# Patient Record
Sex: Female | Born: 1950 | Race: White | Hispanic: No | Marital: Married | Smoking: Former smoker
Health system: Southern US, Academic
[De-identification: ages and names within clinical notes are randomized; demographics above are authoritative.]

## PROBLEM LIST (undated history)

## (undated) DIAGNOSIS — Z9289 Personal history of other medical treatment: Secondary | ICD-10-CM

## (undated) DIAGNOSIS — I10 Essential (primary) hypertension: Secondary | ICD-10-CM

## (undated) DIAGNOSIS — E559 Vitamin D deficiency, unspecified: Secondary | ICD-10-CM

## (undated) DIAGNOSIS — M810 Age-related osteoporosis without current pathological fracture: Secondary | ICD-10-CM

## (undated) DIAGNOSIS — E538 Deficiency of other specified B group vitamins: Secondary | ICD-10-CM

## (undated) DIAGNOSIS — K219 Gastro-esophageal reflux disease without esophagitis: Secondary | ICD-10-CM

## (undated) DIAGNOSIS — Z853 Personal history of malignant neoplasm of breast: Secondary | ICD-10-CM

## (undated) DIAGNOSIS — D649 Anemia, unspecified: Secondary | ICD-10-CM

## (undated) DIAGNOSIS — E7800 Pure hypercholesterolemia, unspecified: Secondary | ICD-10-CM

## (undated) DIAGNOSIS — E079 Disorder of thyroid, unspecified: Secondary | ICD-10-CM

## (undated) HISTORY — DX: Personal history of other medical treatment: Z92.89

## (undated) HISTORY — DX: Gastro-esophageal reflux disease without esophagitis: K21.9

## (undated) HISTORY — DX: Essential (primary) hypertension: I10

## (undated) HISTORY — PX: HX BREAST LUMPECTOMY: SHX2

## (undated) HISTORY — DX: Personal history of malignant neoplasm of breast: Z85.3

## (undated) HISTORY — PX: HX COLONOSCOPY: 2100001147

## (undated) HISTORY — DX: Vitamin D deficiency, unspecified: E55.9

## (undated) HISTORY — DX: Anemia, unspecified: D64.9

## (undated) HISTORY — DX: Disorder of thyroid, unspecified: E07.9

## (undated) HISTORY — DX: Age-related osteoporosis without current pathological fracture: M81.0

## (undated) HISTORY — PX: HX RADICAL MASTECTOMY: SHX4

## (undated) HISTORY — DX: Pure hypercholesterolemia, unspecified: E78.00

## (undated) HISTORY — DX: Deficiency of other specified B group vitamins: E53.8

## (undated) HISTORY — PX: HX TUBAL LIGATION: SHX77

---

## 1995-09-15 ENCOUNTER — Other Ambulatory Visit (HOSPITAL_COMMUNITY): Payer: Self-pay | Admitting: INTERNAL MEDICINE

## 2019-08-29 IMAGING — MG 3D DX MAMMO UNI W/CAD
3 series · 3 of 12 positions shown · non-contrast
Comparison: 03/25/2019 and 03/22/2018.

------------- REPORT [id number] -------------
EXAM:  3D LEFT DIAGNOSTIC DIGITAL MAMMOGRAM WITH TOMOSYNTHESIS AND CAD
INDICATION: History of right breast cancer, status post mastectomy.

[L CC]
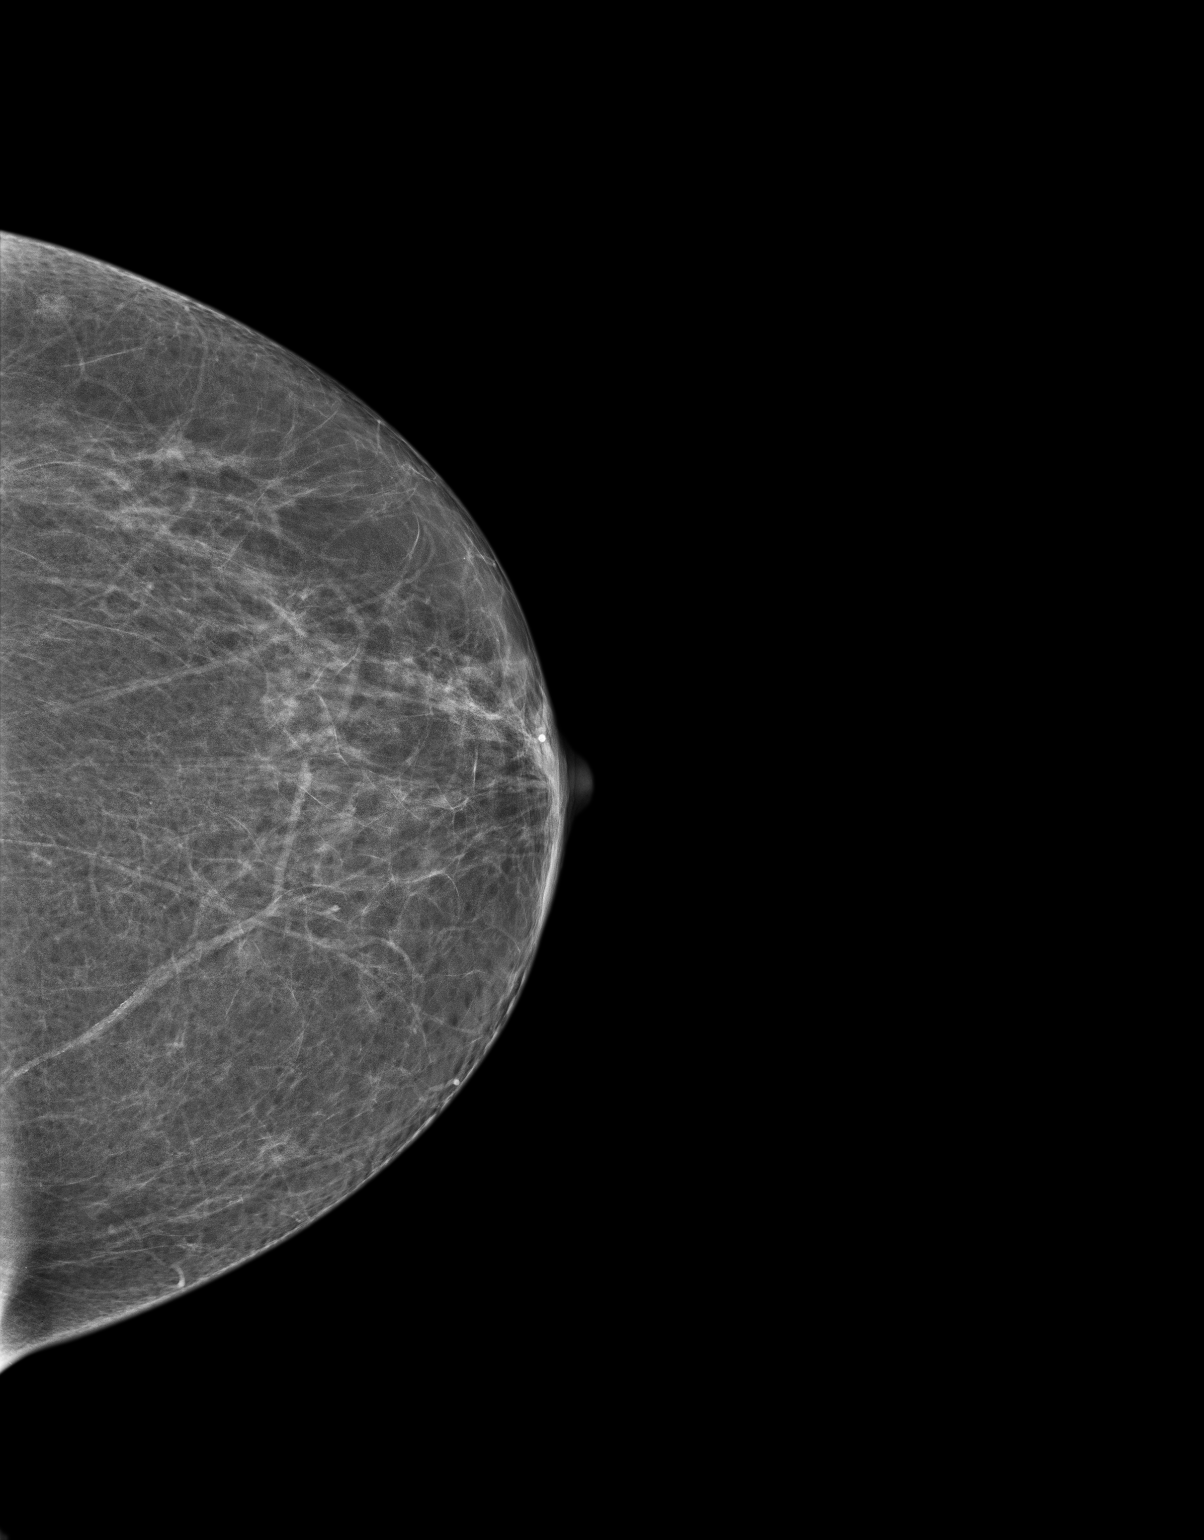

[3D DX MAMMO UNI W/CAD · tomo slice 11/69.0]
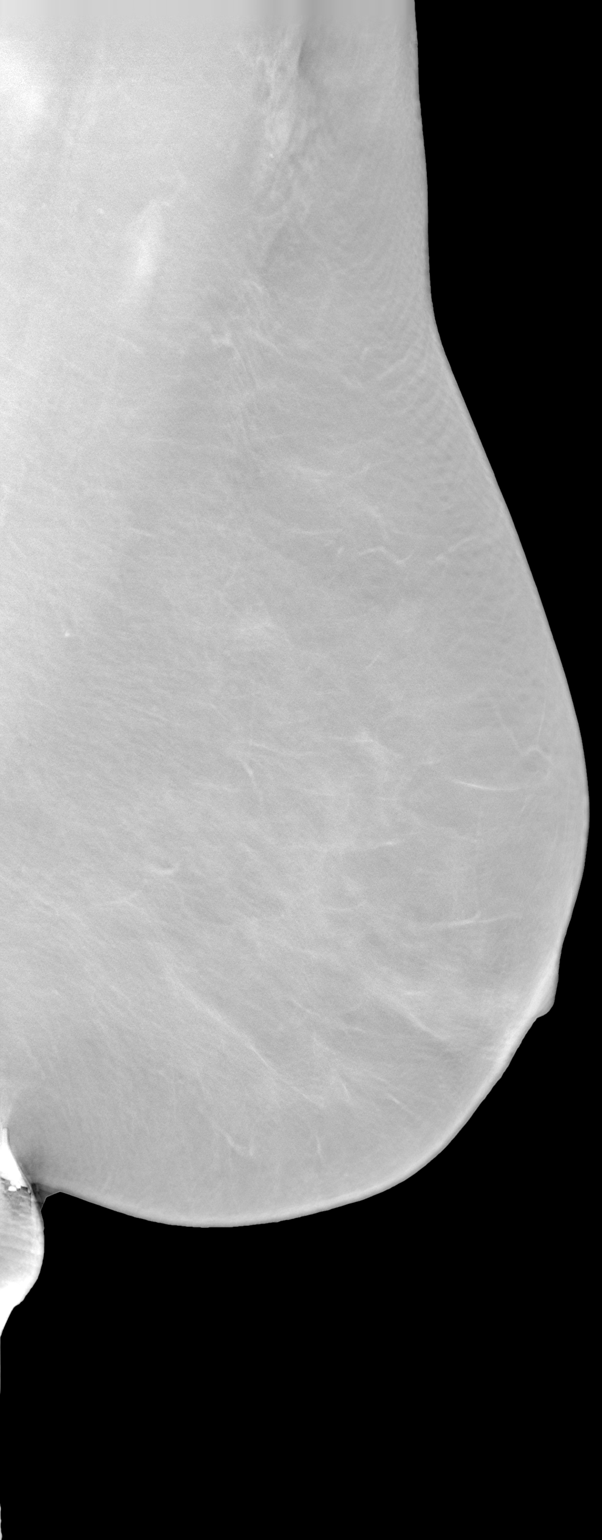

[L]
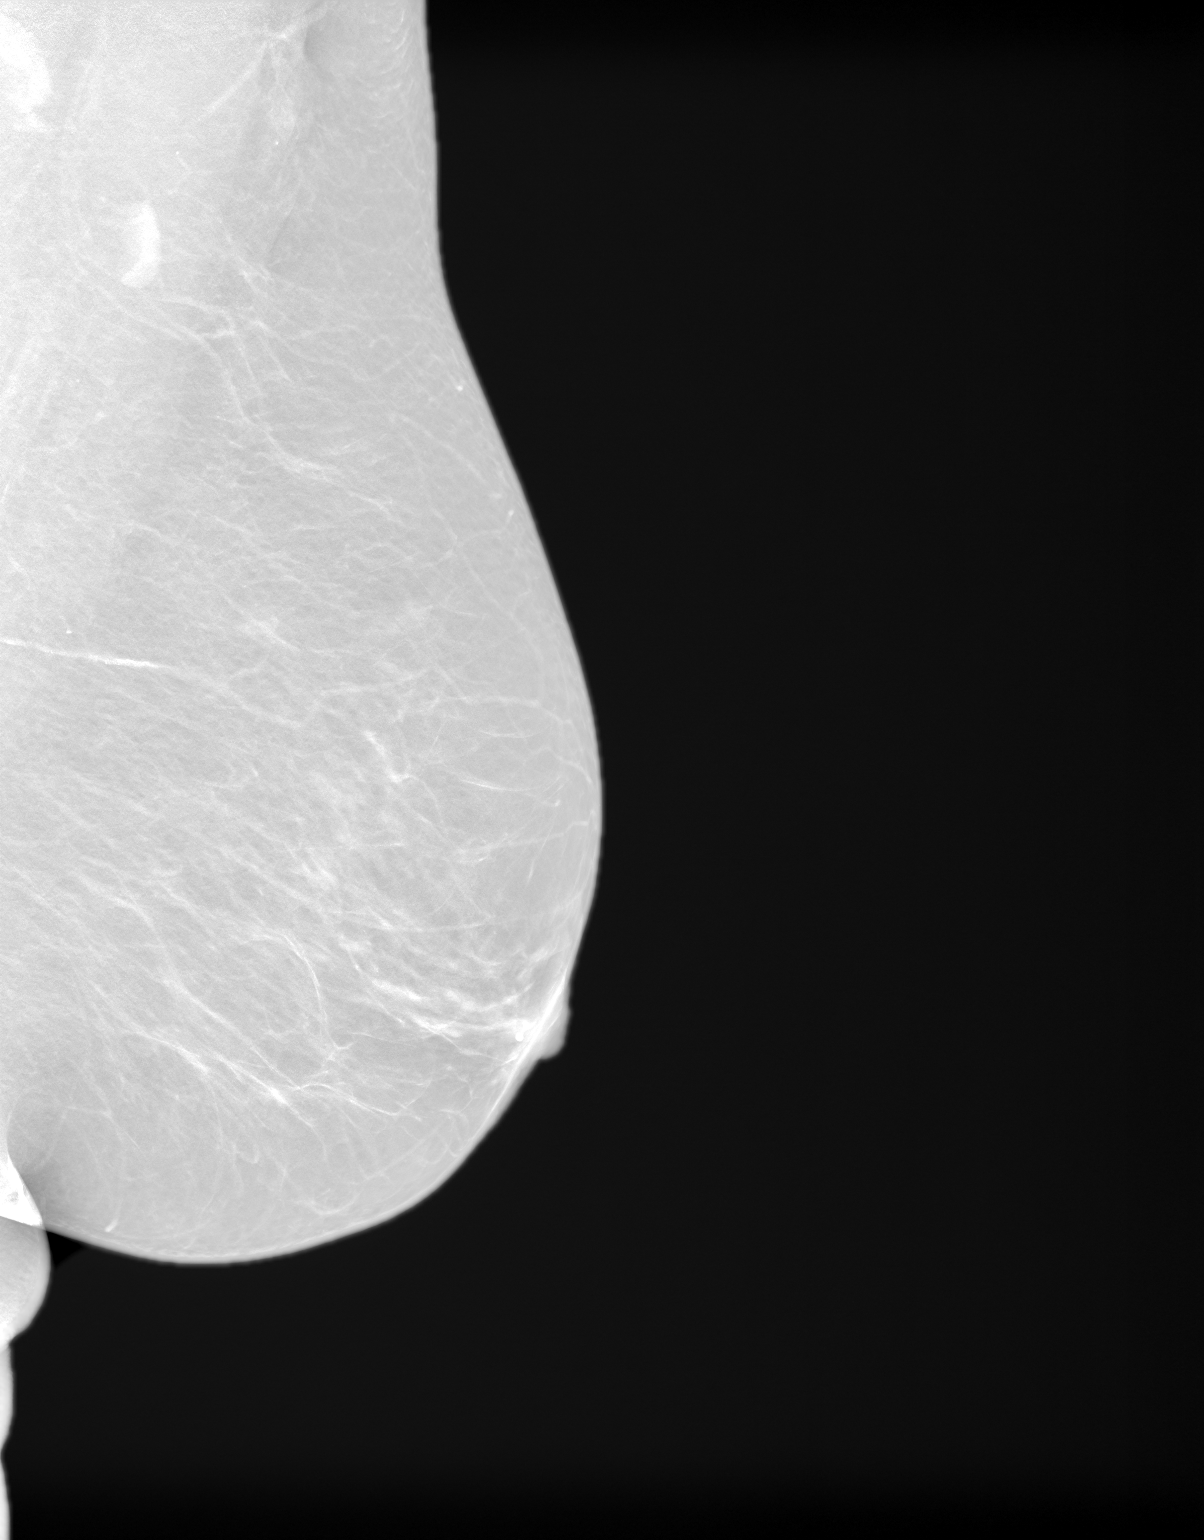

[3 of 12 positions shown; findings below may reference images not displayed]

FINDINGS: There are scattered fibroglandular elements.  There is no mass or suspicious cluster of microcalcifications.   There is no architectural distortion, skin thickening or nipple retraction.
IMPRESSION: 1.  BIRADS 2-Benign findings. Patient has been added in a reminder system with a target date for the next screening mammography.

2.  DENSITY CODE –  B (Scattered areas of fibroglandular density).

Final Assessment Code:

Bi-Rads 2 

BI-RADS 0
Need additional imaging evaluation

BI-RADS 1
Negative mammogram

BI-RADS 2
Benign finding

BI-RADS 3
Probably benign finding; short-interval follow-up suggested

BI-RADS 4
Suspicious abnormality; biopsy should be considered

BI-RADS 5
Highly suggestive of malignancy; appropriate action should be taken

BI-RADS 6
Known biopsy-proven malignancy; appropriate action should be taken

NOTE:
In compliance with Federal regulations, the results of this mammogram are being sent to the patient.

------------- REPORT [id number] -------------
[facility]
[address]
[place] Ms.[place], [place]:
We wish to report the following on your recent mammography examination. We are sending a report to your referring physician or other health care provider. 
(       Normal/Negative:
No evidence of cancer.
This statement is mandated by the [place], Department of Health.
Your examination was performed by one of our technologists, who are registered radiological technologists and also specially certified in mammography:
___
Parlak, Edaly (M)
___
Nepomuceno, Martinez (M)

Your mammogram was interpreted by our radiologist.

( 
Sofeine Made, M.D.

(Annual Breast Examination by a physician or other health care provider
(Annual Mammography Screening beginning at age 40
(Monthly Breast Self Examination

## 2020-09-14 IMAGING — MG 3D DX MAMMO UNI W/CAD & TOMO
3 series · 3 of 12 positions shown · non-contrast
Comparison: 10/06/2020 and 08/11/2019.

------------- REPORT [id number] -------------
[facility]
[address]
[place] Ms.[place], [place] ARVY[place]AS:
We wish to report the following on your recent mammography examination. We are sending a report to your referring physician or other health care provider. 
(       Normal/Negative:
No evidence of cancer.
This statement is mandated by the [place], Department of Health.
Your examination was performed by one of our technologists, who are registered radiological technologists and also specially certified in mammography:
___
Parlak, Edaly (M)
Nepomuceno, Martinez (M)

Your mammogram was interpreted by our radiologist.
( 
Sofeine Made, M.D.
(Annual Breast Examination by a physician or other health care provider
(Annual Mammography Screening beginning at age 40
(Monthly Breast Self Examination
------------- REPORT [id number] -------------
﻿
                        :           &
                         #:
EXAM:  3D LEFT DIAGNOSTIC DIGITAL MAMMOGRAM WITH CAD AND TOMOSYNTHESIS
INDICATION: History of right breast cancer, status post mastectomy.

[L]
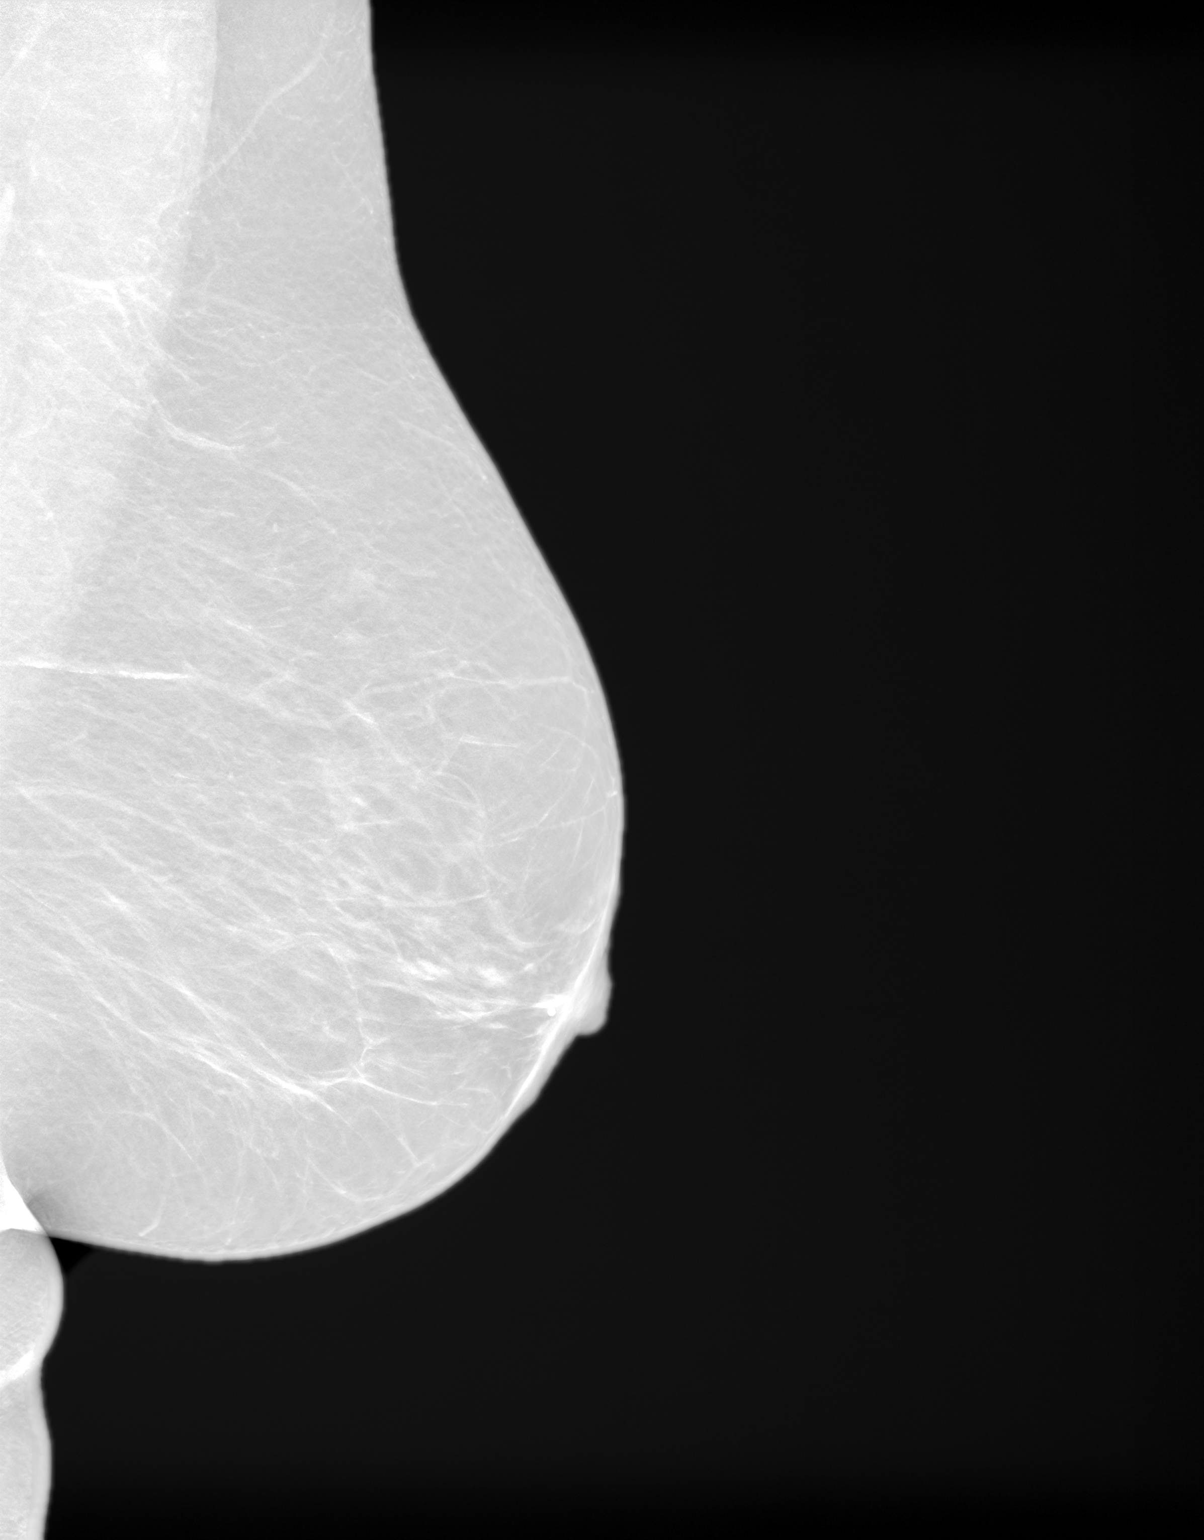

[L CC tomo]
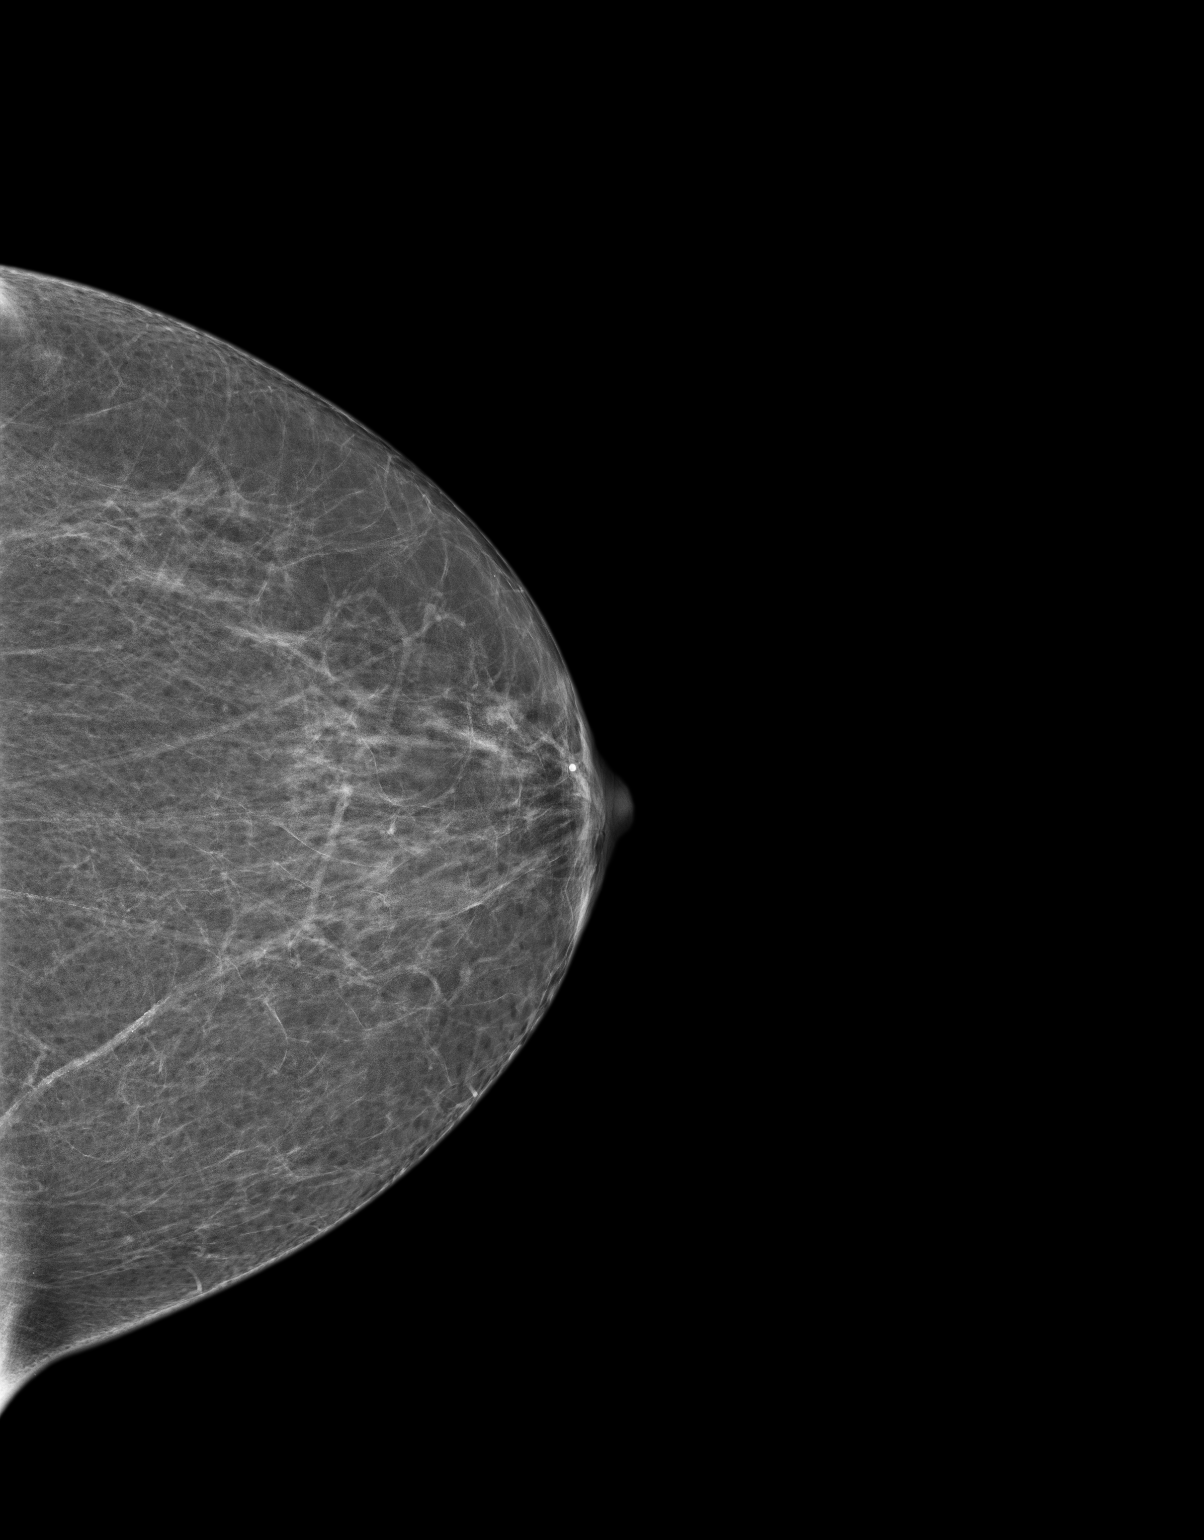

[3D DX MAMMO UNI W/CAD & TOMO tomo · tomo slice 11/68.0]
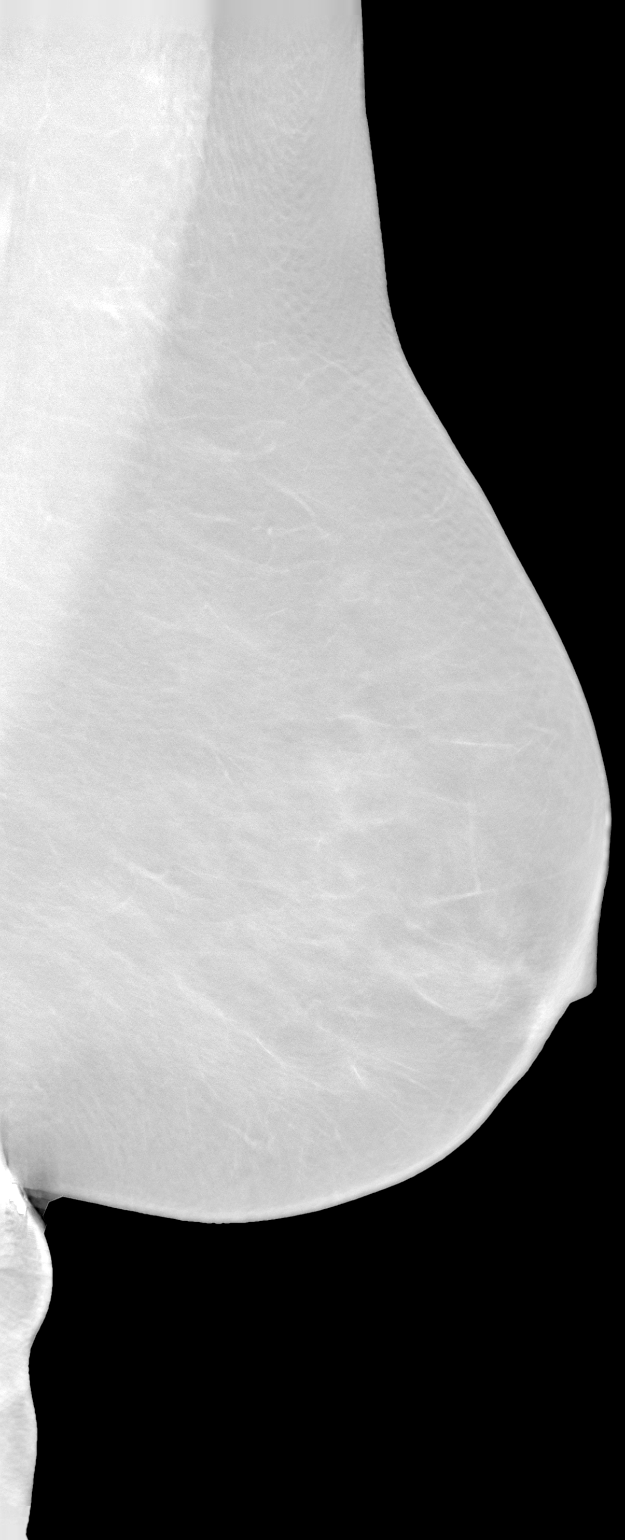

[3 of 12 positions shown; findings below may reference images not displayed]

FINDINGS: There are scattered fibroglandular elements.  There is no mass or suspicious cluster of microcalcifications.   There is no architectural distortion, skin thickening or nipple retraction.
IMPRESSION: 1.  BIRADS 2-Benign findings. Patient has been added in a reminder system with a target date for the next screening mammography.

2.  DENSITY CODE – B (Scattered areas of fibroglandular density). 

Final Assessment Code:

Bi-Rads 2 

BI-RADS 0
Need additional imaging evaluation.

BI-RADS 1
Negative mammogram.

BI-RADS 2
Benign finding.

BI-RADS 3
Probably benign finding; short-interval follow-up suggested.

BI-RADS 4
Suspicious abnormality; biopsy should be considered.

BI-RADS 5
Highly suggestive of malignancy; appropriate action should be taken.

BI-RADS 6
Known biopsy-proven malignancy; appropriate action should be taken.

NOTE:
In compliance with Federal regulations, the results of this mammogram are being sent to the patient.

## 2021-03-13 LAB — ENTER/EDIT EXTERNAL COMMON LAB RESULTS
CHOLESTEROL: 209
HDL-CHOLESTEROL: 53
LDL (CALCULATED): 125
LDL CHOLESTEROL,DIRECT: 31
TRIGLYCERIDES: 174

## 2021-09-13 LAB — ENTER/EDIT EXTERNAL COMMON LAB RESULTS
CHOLESTEROL: 216
HDL-CHOLESTEROL: 50
HEMOGLOBIN A1C: 5.6
LDL (CALCULATED): 128
LDL CHOLESTEROL,DIRECT: 38
TRIGLYCERIDES: 188

## 2021-09-27 ENCOUNTER — Encounter (INDEPENDENT_AMBULATORY_CARE_PROVIDER_SITE_OTHER): Payer: Self-pay | Admitting: NURSE PRACTITIONER

## 2021-09-27 ENCOUNTER — Ambulatory Visit (INDEPENDENT_AMBULATORY_CARE_PROVIDER_SITE_OTHER): Payer: Medicare Other | Admitting: NURSE PRACTITIONER

## 2021-09-27 ENCOUNTER — Other Ambulatory Visit: Payer: Self-pay

## 2021-09-27 VITALS — BP 135/80 | HR 80 | Temp 98.3°F | Ht 63.0 in | Wt 149.0 lb

## 2021-09-27 DIAGNOSIS — D0511 Intraductal carcinoma in situ of right breast: Secondary | ICD-10-CM | POA: Insufficient documentation

## 2021-09-27 DIAGNOSIS — D508 Other iron deficiency anemias: Secondary | ICD-10-CM | POA: Insufficient documentation

## 2021-09-27 DIAGNOSIS — D0510 Intraductal carcinoma in situ of unspecified breast: Secondary | ICD-10-CM

## 2021-09-27 NOTE — Patient Instructions (Signed)
Please take the labs orders given today and go to the first floor registration.  Once registered you will be directed to the lab for completion.  If you choose you may take your labs to [facility] as well.  Please be aware that it does take longer for me to receive results from [facility].  We will call you if needed once your labs are received. I encourage you to keep all your scheduled appointment with physicians.  Mammograms are recommended yearly.  Colonoscopy is recommended starting at age 50 unless symptoms for family history warrant sooner.  Patient with colon cancer screening is based off of when initial diagnosis was and most recent colonoscopy findings.  Immunization recommendations are based on age.   I look forward to seeing you for your next follow up in 4 months.  Please call if you have any questions or concerns.      If you have not already signed up for  [facility], I strongly encourage you to do so.   It will allow to you message us with questions, schedule and reschedule appointments. Once the hospital goes to [place] in March of 2023 it will allow you to see your labs and imaging as well.  If you have not given us your e-mail address and you want to sign up for this service, please give check in or check out your e-mail.  You will receive an e-mail and a link to sign up and then will need to download the app on your phone.    Our office number is [phone].  You will be directed to the call center and then a message will be sent to our office.  If you are awaiting a call back from us please make sure to answer in toll free call numbers, as that is currently what the phone ID shows when we call from our office.      Please be aware that when you are here in the office and we are a [facility] facility.  The hospital is not  a [facility] facility at this time and if you need a port flush or treatment you will have to be registered and scheduled with [facility].    If you are on the [facility] outpatient Oncology side for treatment or  port flush and have not seen the provider on that day, we do not know that you are here, as much as we would like to keep up with everyone we just cannot, and [facility] schedule is not our schedule.  If you have a problem while you are in the infusion center you will need to speak with the nurse who is caring for you. They will get in touch with us.  Please do not stop us in the hall, as we are trying to see patients in a timely manner.      Thank you for allowing us to be part of your healthcare team.

## 2021-09-27 NOTE — Cancer Center Note (Signed)
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      HISTORY OF PRESENT ILLNESS:  Katherine [name] is a 70 y.o. female who presents to today alone for evaluation of DCIS and Iron deficiency.  Review Dr. 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  Pap smear time she presented with a scaly lesion of the right nipple in June of 1999.  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  The nuclear grade was III/III.  She underwent a segmental resection of the lesion on May 11, 1998 and the margin was initially positive.  She underwent a wide marginal incision in August of 1999 but there remained a question of a small focus of residual DCIS noted. The patient had radiation therapy over 6 weeks time to the entire right breast.  Was placed on tamoxifen 10 mg p.o. b.i.d..  She continued to have some scaliness and discharge the right nipple.  And on 02/08/2000 another biopsy was taken of the right breast nipple and this showed the presence of Paget's disease of the nipple associated with high-grade DCIS within the underlying breast tissue.  And the additional portion of the skin from the nipple decided Paget's disease with an underlying intraductal carcinoma.  There was no evidence for any invasive disease. Because of the recurrence of disease swelling radiation therapy while on tamoxifen patient had a mastectomy completed on 02/23/2000.   In 2018 patient was found have iron deficiency anemia.  Patient receives intermittent Venofer as needed for her iron deficiency.  Her last mammogram  was 09/14/2020 was benign.     Today she reports that she is doing well.  She denies any new complaints.  She reports that she has seen Dr. Tomasita Crumble on 09/13/2021.  He is supposed to be scheduling her mammogram.  She denies any PICA, muscle weakness, and creased fatigue.  Denies fever, chills, diaphoresis, night sweats, and malaise. No infections during the interim.     Initial Diagnosis:  04/1998-  Paget's Disease with DCIS  02/08/2000 Paget's disease reoccurrence DCIS    Surgeries:  6/28/199 Right breast segmental resection with positive margins.   06/1998  Right breast wide marginal incision with questionable focus of residual DCIS  02/23/2000 right breast mastectomy    Treatment History:  Tamoxifen 10 mg PO BID for 5 years.   Radiation therapy 5040 cGy in 28 fractions over six weeks.     REVIEW OF SYSTEMS:  Review of Systems   Constitutional: Negative.    HENT:  Negative.    Eyes: Negative.    Respiratory: Negative.    Cardiovascular: Negative.    Gastrointestinal: Negative.    Endocrine: Negative.    Genitourinary: Negative.     Musculoskeletal: Negative.    Skin: Negative.    Neurological: Negative.    Hematological: Negative.    Psychiatric/Behavioral: Negative.         Past Medical History:   Diagnosis Date    Anemia  Essential hypertension     Hx of breast cancer     Hx of transfusion     Thyroid disease            Past Surgical History:   Procedure Laterality Date    HX BREAST LUMPECTOMY      HX COLONOSCOPY      HX RADICAL MASTECTOMY      HX TUBAL LIGATION             Social History     Socioeconomic History    Marital status: Married     Spouse name: Not on file    Number of children: Not on file    Years of education: Not on file    Highest education level: Not on file   Occupational History    Not on file   Tobacco Use    Smoking status: Former     Types: Cigarettes    Smokeless tobacco: Never   Vaping Use    Vaping Use: Never used   Substance and Sexual Activity    Alcohol use: Never     Drug use: Never    Sexual activity: Not on file   Other Topics Concern    Not on file   Social History Narrative    Not on file     Social Determinants of Health     Financial Resource Strain: Not on file   Food Insecurity: Not on file   Transportation Needs: Not on file   Physical Activity: Not on file   Stress: Not on file   Intimate Partner Violence: Not on file   Housing Stability: Not on file       Social History     Social History Narrative    Not on file       Social History     Substance and Sexual Activity   Drug Use Never       Family Medical History:     Problem Relation (Age of Onset)    Blood Clots Daughter    Hypertension (High Blood Pressure) Mother    Lung Cancer Father    Thyroid Disease Mother            Current Outpatient Medications   Medication Sig    ergocalciferol, vitamin D2, (DRISDOL) 1,250 mcg (50,000 unit) Oral Capsule Take 1 Capsule (50,000 Units total) by mouth Every 7 days    Ibuprofen (MOTRIN) 800 mg Oral Tablet TAKE 1 TABLET BY MOUTH ONCE DAILY AS NEEDED WITH FOOD    levothyroxine (SYNTHROID) 75 mcg Oral Tablet     lisinopriL (PRINIVIL) 10 mg Oral Tablet     montelukast (SINGULAIR) 10 mg Oral Tablet     omeprazole (PRILOSEC) 40 mg Oral Capsule, Delayed Release(E.C.)        No Known Allergies      PHYSICAL EXAM:  BP 135/80 (Site: Left, Patient Position: Sitting, Cuff Size: Adult)    Pulse 80    Temp 36.8 C (98.3 F) (Oral)    Ht 1.6 m (5\' 3" )    Wt 67.6 kg (149 lb)    BMI 26.39 kg/m        ECOG Status: (0) Fully active, able to carry on all predisease performance without restriction   Physical Exam  Vitals reviewed.   Constitutional:       Appearance: Normal appearance. She is normal weight.   HENT:      Head: Normocephalic.   Eyes:  Conjunctiva/sclera: Conjunctivae normal.      Pupils: Pupils are equal, round, and reactive to light.   Neck:      Thyroid: No thyroid mass, thyromegaly or thyroid tenderness.   Cardiovascular:      Rate and Rhythm: Normal rate and regular  rhythm.      Pulses: Normal pulses.      Heart sounds: Normal heart sounds, S1 normal and S2 normal. No murmur heard.    No S3 or S4 sounds.   Pulmonary:      Effort: Pulmonary effort is normal.      Breath sounds: Normal breath sounds.   Chest:      Comments: Patient decline breast exam today  Abdominal:      General: Bowel sounds are normal.      Palpations: Abdomen is soft.   Musculoskeletal:         General: Normal range of motion.      Cervical back: Normal range of motion and neck supple.      Right lower leg: No edema.      Left lower leg: No edema.   Lymphadenopathy:      Head:      Right side of head: No submental, submandibular, posterior auricular or occipital adenopathy.      Left side of head: No submental, submandibular, posterior auricular or occipital adenopathy.      Cervical: No cervical adenopathy.      Right cervical: No superficial, deep or posterior cervical adenopathy.     Left cervical: No superficial, deep or posterior cervical adenopathy.      Upper Body:      Right upper body: No supraclavicular or axillary adenopathy.      Left upper body: No supraclavicular or axillary adenopathy.      Lower Body: No right inguinal adenopathy. No left inguinal adenopathy.   Skin:     General: Skin is warm and dry.      Capillary Refill: Capillary refill takes less than 2 seconds.   Neurological:      General: No focal deficit present.      Mental Status: She is alert. Mental status is at baseline.      Sensory: Sensation is intact.      Motor: Motor function is intact.      Coordination: Coordination is intact.      Gait: Gait is intact.   Psychiatric:         Attention and Perception: Attention and perception normal.         Mood and Affect: Mood and affect normal.         Speech: Speech normal.         Behavior: Behavior normal. Behavior is cooperative.         Thought Content: Thought content normal.         Cognition and Memory: Cognition and memory normal.         Judgment: Judgment normal.        LABS:   05/11/21  WBC 5.7, Hgb 14.4, Hct 42.1, Plt 299,000  Iron 86, Ferritin 26, STR 16.4     ASSESSMENT:    ICD-10-CM    1. DCIS (ductal carcinoma in situ)  D05.10 CBC/DIFF     COMPREHENSIVE METABOLIC PANEL, NON-FASTING      2. Iron deficiency anemia secondary to inadequate dietary iron intake  D50.8 CBC/DIFF     COMPREHENSIVE METABOLIC PANEL, NON-FASTING     IRON     FERRITIN  IRON TRANSFERRIN AND TIBC           PLAN:   1. All relative external and internal medical records were reviewed from Dr. Pierre Bali office  including available H&Ps, progress notes, procedure notes, imaging's, laboratories, and pathology/  2.  Patient was given order for labs today to include CBC, CMP, Iron, TIBC and Ferritin.   She will let me know if her mammogram does not get scheduled.  I will see her in 4 months sooner if needed.     Return in about 4 months (around 01/25/2022).     BAILY HOVANEC was given the chance to ask questions, and these were answered to their satisfaction. The patient is welcome to call with any questions or concerns in the meantime.     On the day of the encounter, I spent a total of 29 minutes on this patient encounter including review of historical information, examination, documentation and post-visit activities.     Patrcia Dolly, APRN,FNP-BC  09/27/2021, 13:22     This note was partially generated using M[employer] Fluency Direct system, and there may be some incorrect words, spellings, and punctuation that were not noted in checking the note before saving.     CC:  Adriana Mccallum, DO  [address]  [place] [id number]

## 2021-10-27 LAB — COLOGUARD® COLON CANCER SCREEN: COLOGUARD RESULT: NEGATIVE

## 2021-10-27 LAB — LAB: COLOGUARD RESULT: NEGATIVE

## 2021-12-02 IMAGING — MG 3D DX MAMMO UNI W/CAD & TOMO
3 series · 4 of 13 positions shown · non-contrast
Comparison: 08/26/2022 and 08/09/2021.

------------- REPORT [id number] -------------
﻿

EXAM:  3D LEFT DIAGNOSTIC DIGITAL MAMMOGRAM WITH CAD AND TOMOSYNTHESIS
INDICATION: History of right breast cancer, status post mastectomy.

[L]
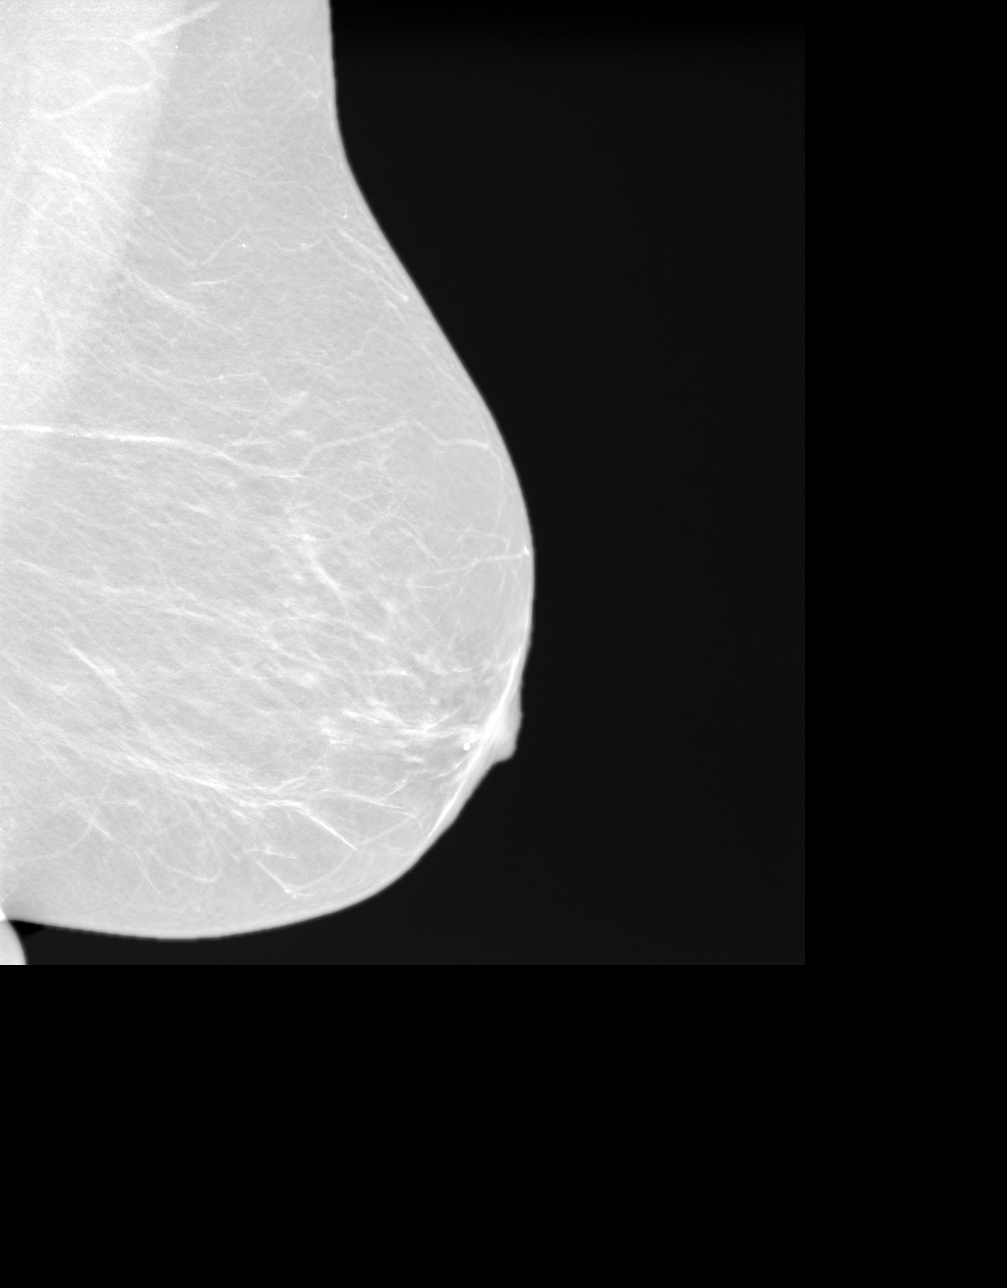

[L CC tomo · left · 0.10mm/px · 2 of 2 slices shown]
[im 1/2]
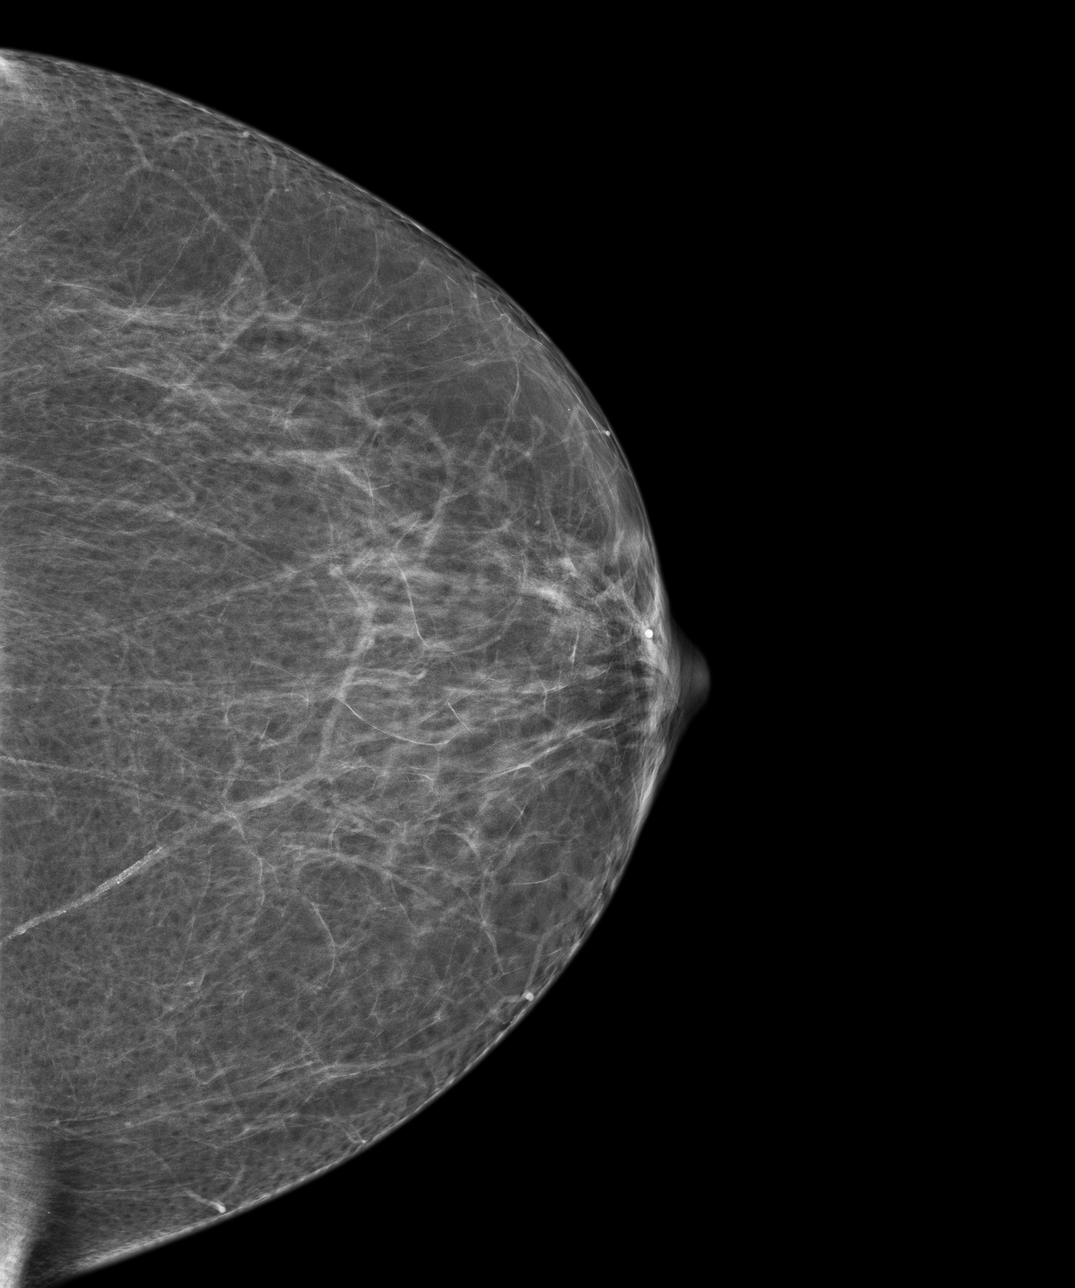
[im 2/2]
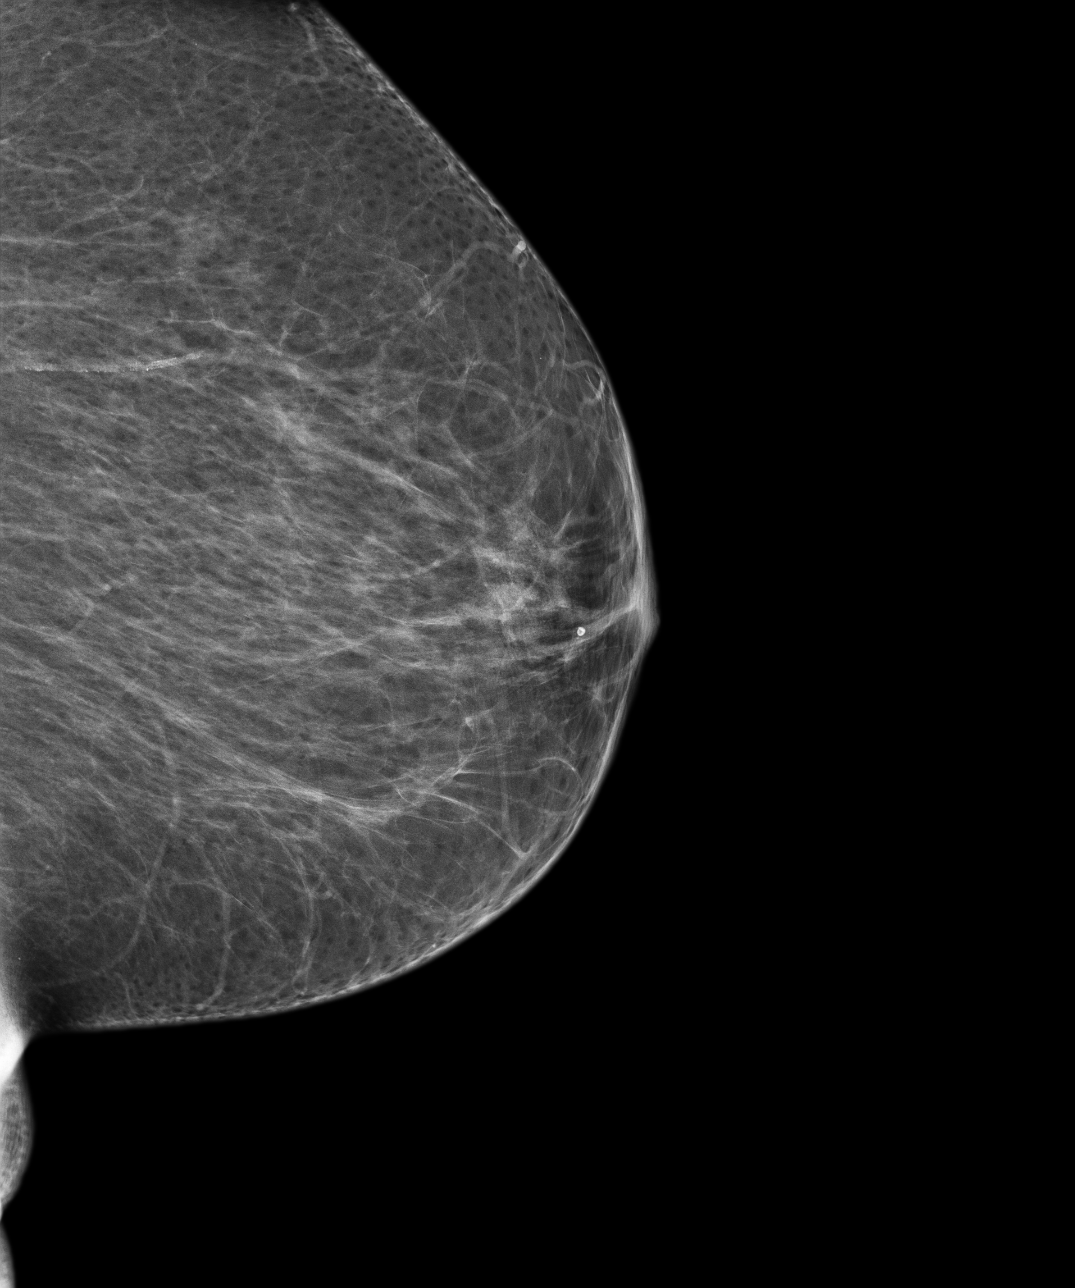

[3D DX MAMMO UNI W/CAD & TOMO tomo · tomo slice 10/64.0]
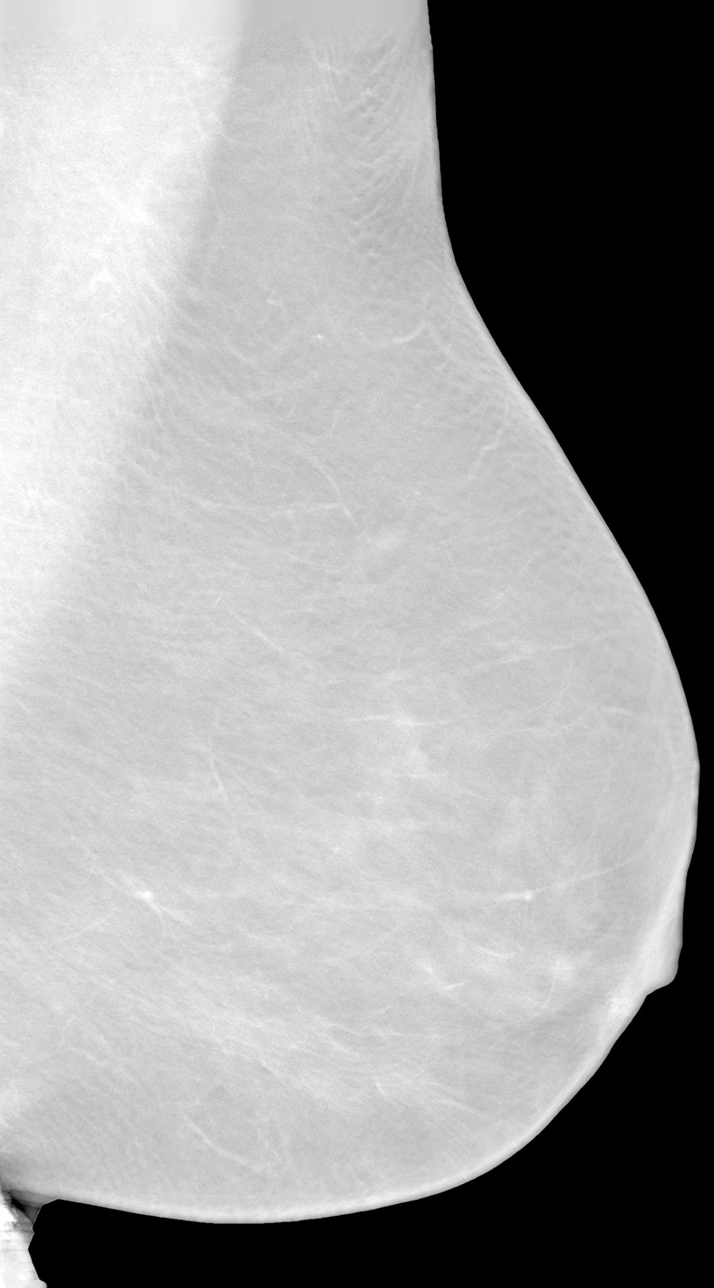

[4 of 13 positions shown; findings below may reference images not displayed]

FINDINGS: There are scattered fibroglandular elements.  There is no mass or suspicious cluster of microcalcifications.   There is no architectural distortion, skin thickening or nipple retraction.
IMPRESSION: 1.  BIRADS 2-Benign findings. Patient has been added in a reminder system with a target date for the next screening mammography.

2.  DENSITY CODE – B (Scattered areas of fibroglandular density). 

Final Assessment Code:

Bi-Rads 2 

BI-RADS 0
 Need additional imaging evaluation.

BI-RADS 1
 Negative mammogram.

BI-RADS 2
 Benign finding.

BI-RADS 3
 Probably benign finding; short-interval follow-up suggested.

BI-RADS 4
 Suspicious abnormality; biopsy should be considered.

BI-RADS 5
 Highly suggestive of malignancy; appropriate action should be taken.

BI-RADS 6
 Known biopsy-proven malignancy; appropriate action should be taken.

NOTE:
In compliance with Federal regulations, the results of this mammogram are being sent to the patient.

------------- REPORT [id number] -------------
[facility]
[address]
[place] Ms.[place], [place]:
We wish to report the following on your recent mammography examination. We are sending a report to your referring physician or other health care provider. 
(       Normal/Negative:
No evidence of cancer.
This statement is mandated by the [place], Department of Health.
Your examination was performed by one of our technologists, who are registered radiological technologists and also specially certified in mammography:
___
Markland, Marjuan (M)

Your mammogram was interpreted by our radiologist.

( 
Collette Sedman, M.D.

(Annual Breast Examination by a physician or other health care provider
(Annual Mammography Screening beginning at age 40
(Monthly Breast Self Examination

## 2022-01-05 ENCOUNTER — Encounter (INDEPENDENT_AMBULATORY_CARE_PROVIDER_SITE_OTHER): Payer: Self-pay | Admitting: NURSE PRACTITIONER

## 2022-01-12 ENCOUNTER — Other Ambulatory Visit: Payer: Self-pay

## 2022-01-24 ENCOUNTER — Encounter (INDEPENDENT_AMBULATORY_CARE_PROVIDER_SITE_OTHER): Payer: Self-pay | Admitting: NURSE PRACTITIONER

## 2022-02-22 ENCOUNTER — Encounter (INDEPENDENT_AMBULATORY_CARE_PROVIDER_SITE_OTHER): Payer: Self-pay | Admitting: Hematology & Oncology

## 2022-04-05 ENCOUNTER — Ambulatory Visit: Payer: Medicare Other | Attending: NURSE PRACTITIONER | Admitting: NURSE PRACTITIONER

## 2022-04-05 ENCOUNTER — Other Ambulatory Visit: Payer: Self-pay

## 2022-04-05 ENCOUNTER — Encounter (HOSPITAL_COMMUNITY): Payer: Self-pay | Admitting: NURSE PRACTITIONER

## 2022-04-05 VITALS — BP 133/64 | HR 96 | Temp 97.7°F | Ht 63.0 in | Wt 146.4 lb

## 2022-04-05 DIAGNOSIS — Z08 Encounter for follow-up examination after completed treatment for malignant neoplasm: Secondary | ICD-10-CM | POA: Insufficient documentation

## 2022-04-05 DIAGNOSIS — Z801 Family history of malignant neoplasm of trachea, bronchus and lung: Secondary | ICD-10-CM | POA: Insufficient documentation

## 2022-04-05 DIAGNOSIS — D508 Other iron deficiency anemias: Secondary | ICD-10-CM

## 2022-04-05 DIAGNOSIS — C50011 Malignant neoplasm of nipple and areola, right female breast: Secondary | ICD-10-CM

## 2022-04-05 DIAGNOSIS — D0511 Intraductal carcinoma in situ of right breast: Secondary | ICD-10-CM

## 2022-04-05 DIAGNOSIS — Z87891 Personal history of nicotine dependence: Secondary | ICD-10-CM | POA: Insufficient documentation

## 2022-04-05 DIAGNOSIS — Z853 Personal history of malignant neoplasm of breast: Secondary | ICD-10-CM | POA: Insufficient documentation

## 2022-04-05 NOTE — Cancer Center Note (Signed)
Katherine [name]  [id number]  03-22-1951   04/05/2022       Department of Hematology/Oncology  Return Patient Visit           REFERRING PROVIDER:  Adriana Mccallum, DO  [address]  [place],  [place] [address]      REASON FOR OFFICE VISIT:  Ongoing management and evaluation of  Right DCIS and iron deficiency anemia.      HISTORY OF PRESENT ILLNESS:  Katherine [name] is a 71 y.o. female who presents to today alone for evaluation of DCIS and Iron deficiency.  She denies any symptoms of iron deficiency at this time. The patient offers no new complaints at this time.  The patient has had a good appetite and stable weight.  The patient has not noted any new areas of disease on own self exam.  The patient has not had any chest pain or dyspnea.  The patient has not had any headaches or changes in vision.  The patient has not had any abdominal pain, nausea, or vomiting.  There have been no changes in bowel or bladder habits.  The patient has not had any abnormal bleeding or clotting episodes.  There have been no complaints of fever, chills, cough, sputum production, dysuria, or diarrhea.      Initial Diagnosis:  04/1998-  Right breast Paget's Disease with DCIS   02/08/2000 Right breast Paget's disease reoccurrence DCIS    Surgeries:    6/28/199 Right breast segmental resection with positive margins.   06/1998  Right breast wide marginal incision with questionable focus of residual DCIS  02/23/2000 right breast mastectomy    Treatment History:  Tamoxifen 10 mg PO BID for 5 years.   Radiation therapy 5040 cGy in 28 fractions over six weeks.     REVIEW OF SYSTEMS:  Review of Systems   Constitutional: Negative.  Negative for chills and fever.   HENT:  Negative.    Eyes: Negative.    Respiratory: Negative.  Negative for shortness of breath.    Cardiovascular: Negative.  Negative for chest pain.   Gastrointestinal: Negative.  Negative for diarrhea, nausea and vomiting.   Endocrine: Negative.    Genitourinary: Negative.  Negative  for difficulty urinating.    Musculoskeletal: Negative.  Negative for arthralgias and back pain.   Skin: Negative.    Neurological: Negative.  Negative for dizziness and headaches.   Hematological: Negative.  Negative for adenopathy. Does not bruise/bleed easily.   Psychiatric/Behavioral: Negative.         Past Medical History:   Diagnosis Date   . Anemia    . Essential hypertension    . Hx of breast cancer    . Hx of transfusion    . Thyroid disease            Past Surgical History:   Procedure Laterality Date   . HX BREAST LUMPECTOMY     . HX COLONOSCOPY     . HX RADICAL MASTECTOMY     . HX TUBAL LIGATION             Social History     Socioeconomic History   . Marital status: Married     Spouse name: Not on file   . Number of children: Not on file   . Years of education: Not on file   . Highest education level: Not on file   Occupational History   . Not on file   Tobacco Use   .  Smoking status: Former     Types: Cigarettes   . Smokeless tobacco: Never   Vaping Use   . Vaping Use: Never used   Substance and Sexual Activity   . Alcohol use: Never   . Drug use: Never   . Sexual activity: Not on file   Other Topics Concern   . Not on file   Social History Narrative   . Not on file     Social Determinants of Health     Financial Resource Strain: Not on file   Transportation Needs: Not on file   Social Connections: Not on file   Intimate Partner Violence: Not on file   Housing Stability: Not on file       Social History     Social History Narrative   . Not on file       Social History     Substance and Sexual Activity   Drug Use Never       Family Medical History:     Problem Relation (Age of Onset)    Blood Clots Daughter    Hypertension (High Blood Pressure) Mother    Lung Cancer Father    Thyroid Disease Mother            Current Outpatient Medications   Medication Sig   . alendronate (FOSAMAX) 70 mg Oral Tablet    . ergocalciferol, vitamin D2, (DRISDOL) 1,250 mcg (50,000 unit) Oral Capsule Take 1 Capsule (50,000  Units total) by mouth Every 7 days   . Ibuprofen (MOTRIN) 800 mg Oral Tablet TAKE 1 TABLET BY MOUTH ONCE DAILY AS NEEDED WITH FOOD   . levothyroxine (SYNTHROID) 75 mcg Oral Tablet    . lisinopriL (PRINIVIL) 10 mg Oral Tablet    . montelukast (SINGULAIR) 10 mg Oral Tablet    . omeprazole (PRILOSEC) 40 mg Oral Capsule, Delayed Release(E.C.)        No Known Allergies      PHYSICAL EXAM:  BP 133/64 (Site: Left, Patient Position: Sitting, Cuff Size: Adult)   Pulse 96   Temp 36.5 C (97.7 F) (Temporal)   Ht 1.6 m ('5\' 3"'$ )   Wt 66.4 kg (146 lb 6.4 oz)   BMI 25.93 kg/m        ECOG Status: (0) Fully active, able to carry on all predisease performance without restriction   Physical Exam  Vitals reviewed.   Constitutional:       Appearance: Normal appearance. She is normal weight.   HENT:      Head: Normocephalic.   Eyes:      Conjunctiva/sclera: Conjunctivae normal.      Pupils: Pupils are equal, round, and reactive to light.   Neck:      Thyroid: No thyroid mass, thyromegaly or thyroid tenderness.   Cardiovascular:      Rate and Rhythm: Normal rate and regular rhythm.      Pulses: Normal pulses.      Heart sounds: Normal heart sounds, S1 normal and S2 normal. No murmur heard.    No S3 or S4 sounds.   Pulmonary:      Effort: Pulmonary effort is normal.      Breath sounds: Normal breath sounds.   Abdominal:      General: Bowel sounds are normal.      Palpations: Abdomen is soft.   Musculoskeletal:         General: Normal range of motion.      Cervical back: Normal range of motion and  neck supple.      Right lower leg: No edema.      Left lower leg: No edema.   Lymphadenopathy:      Head:      Right side of head: No submental, submandibular, posterior auricular or occipital adenopathy.      Left side of head: No submental, submandibular, posterior auricular or occipital adenopathy.      Cervical: No cervical adenopathy.      Right cervical: No superficial, deep or posterior cervical adenopathy.     Left cervical: No  superficial, deep or posterior cervical adenopathy.      Upper Body:      Right upper body: No supraclavicular or axillary adenopathy.      Left upper body: No supraclavicular or axillary adenopathy.      Lower Body: No right inguinal adenopathy. No left inguinal adenopathy.   Skin:     General: Skin is warm and dry.      Capillary Refill: Capillary refill takes less than 2 seconds.   Neurological:      General: No focal deficit present.      Mental Status: She is alert. Mental status is at baseline.      Sensory: Sensation is intact.      Motor: Motor function is intact.      Coordination: Coordination is intact.      Gait: Gait is intact.   Psychiatric:         Attention and Perception: Attention and perception normal.         Mood and Affect: Mood and affect normal.         Speech: Speech normal.         Behavior: Behavior normal. Behavior is cooperative.         Thought Content: Thought content normal.         Cognition and Memory: Cognition and memory normal.         Judgment: Judgment normal.         Radiology:   Mammogram 2023 at [facility]    LABS:      Media Information              ASSESSMENT:    ICD-10-CM    1. Ductal carcinoma in situ (DCIS) of right breast  D05.11       2. 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 Iron-deficiency anemia:  Resolved at this time.  Denies any current symptoms of iron deficiency.  Her last 5 not show iron-deficiency.  2.  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  She continues to have yearly mammograms as scheduled.  3. Former smoker of approximately 20 pack years.  I did discuss with the patient low-dose CT scanning and she will discuss this with her primary care physician.  4. Discussed with patient options for follow-up she is been stable for quite some time and we could go to six-month, 1 year or even follow-up as needed.  Patient has decided that she will just do  a follow-up as needed and return if symptoms fail to improve or worsen.    Return if symptoms worsen or fail to improve.     Katherine [name] was given the chance to ask questions, and these were answered to their satisfaction. The patient is welcome to  call with any questions or concerns in the meantime.     On the day of the encounter, I spent a total of 31 minutes on this patient encounter including review of historical information, examination, documentation and post-visit activities.     Patrcia Dolly, APRN,FNP-BC  09/27/2021, 13:22     This note was partially generated using MModal Fluency Direct system, and there may be some incorrect words, spellings, and punctuation that were not noted in checking the note before saving.     CC:  [place]  [place] [id number]

## 2022-07-15 ENCOUNTER — Encounter (INDEPENDENT_AMBULATORY_CARE_PROVIDER_SITE_OTHER): Payer: Self-pay | Admitting: Family

## 2022-07-21 ENCOUNTER — Encounter (INDEPENDENT_AMBULATORY_CARE_PROVIDER_SITE_OTHER): Payer: Self-pay | Admitting: Family

## 2022-07-21 DIAGNOSIS — K219 Gastro-esophageal reflux disease without esophagitis: Secondary | ICD-10-CM | POA: Insufficient documentation

## 2022-07-21 DIAGNOSIS — E039 Hypothyroidism, unspecified: Secondary | ICD-10-CM | POA: Insufficient documentation

## 2022-08-02 ENCOUNTER — Encounter (INDEPENDENT_AMBULATORY_CARE_PROVIDER_SITE_OTHER): Payer: Self-pay | Admitting: Family

## 2022-08-03 ENCOUNTER — Encounter (INDEPENDENT_AMBULATORY_CARE_PROVIDER_SITE_OTHER): Payer: Self-pay | Admitting: Family

## 2022-08-03 ENCOUNTER — Other Ambulatory Visit: Payer: Self-pay

## 2022-08-03 ENCOUNTER — Ambulatory Visit (INDEPENDENT_AMBULATORY_CARE_PROVIDER_SITE_OTHER): Payer: Medicare Other | Admitting: Family

## 2022-08-03 VITALS — BP 126/74 | HR 81 | Ht 63.0 in | Wt 141.0 lb

## 2022-08-03 DIAGNOSIS — D508 Other iron deficiency anemias: Secondary | ICD-10-CM

## 2022-08-03 DIAGNOSIS — E039 Hypothyroidism, unspecified: Secondary | ICD-10-CM

## 2022-08-03 DIAGNOSIS — Z1239 Encounter for other screening for malignant neoplasm of breast: Secondary | ICD-10-CM

## 2022-08-03 DIAGNOSIS — E785 Hyperlipidemia, unspecified: Secondary | ICD-10-CM

## 2022-08-03 NOTE — Progress Notes (Signed)
[facility]  [address]  [place] [address]       Name: NOTNAMED CROUCHER MRN:  [id number]   Date: 08/03/2022 Age: 71 y.o.       Chief Complaint:    Chief Complaint   Patient presents with    New Patient     New patient to get established.         HPI:  Katherine [name] is a 71 y.o. female who presents for follow-up.  [facility]          Past Medical History:  Past Medical History:   Diagnosis Date    Anemia     B12 deficiency     Esophageal reflux     Essential hypertension     Hx of breast cancer     Hx of transfusion     Hypercholesterolemia     Osteoporosis     Thyroid disease     Vitamin D deficiency          Past Surgical History:   Procedure Laterality Date    HX BREAST LUMPECTOMY      HX COLONOSCOPY      HX RADICAL MASTECTOMY      HX TUBAL LIGATION        Current Outpatient Medications   Medication Sig    albuterol sulfate (PROVENTIL OR VENTOLIN OR PROAIR) 90 mcg/actuation Inhalation oral inhaler Take 1-2 Puffs by inhalation Every 6 hours as needed    cyanocobalamin (VITAMIN B12) 2,500 mcg Sublingual Tablet, Sublingual Place 1 Tablet (2,500 mcg total) under the tongue Once a day    ergocalciferol, vitamin D2, (DRISDOL) 1,250 mcg (50,000 unit) Oral Capsule Take 1 Capsule (50,000 Units total) by mouth Every 7 days    Ibuprofen (MOTRIN) 800 mg Oral Tablet TAKE 1 TABLET BY MOUTH ONCE DAILY AS NEEDED WITH FOOD    levothyroxine (SYNTHROID) 75 mcg Oral Tablet     lisinopriL (PRINIVIL) 10 mg Oral Tablet 1 Tablet (10 mg total) Once a day Takes 1.5 tables  so she has been doing 15 mg.    montelukast (SINGULAIR) 10 mg Oral Tablet     moxifloxacin (VIGAMOX) 0.5 % Ophthalmic Drops INSTILL 1 DROP INTO LEFT EYE 4 TIMES DAILY, START 1 DAY PRIOR TO SURGERY    omeprazole (PRILOSEC) 20 mg Oral Capsule, Delayed Release(E.C.) Take 1 Capsule (20 mg total) by mouth Every morning     Allergies   Allergen Reactions    Boniva [Ibandronate]     Lipitor  [Atorvastatin]        Family History:  Family Medical History:       Problem Relation (Age of Onset)    Blood Clots Daughter    Hypertension (High Blood Pressure) Mother    Lung Cancer Father    Thyroid Disease Mother              Social History:   Social History     Tobacco Use   Smoking Status Former    Types: Cigarettes   Smokeless Tobacco Never     Social History     Substance and Sexual Activity   Alcohol Use Never     Social History     Occupational History    Not on file       Review of Systems:  Review of systems as discussed in HPI    Problem List:  Patient Active Problem List   Diagnosis  Ductal carcinoma in situ (DCIS) of right breast    Iron deficiency anemia secondary to inadequate dietary iron intake    Paget's disease of female breast, right (CMS [facility])    Hypothyroidism    GERD (gastroesophageal reflux disease)       Physical Examination:  BP 126/74   Pulse 81   Ht 1.6 m ('5\' 3"'$ )   Wt 64 kg (141 lb)   SpO2 99%   BMI 24.98 kg/m       Physical Exam  Constitutional:       General: She is not in acute distress.     Appearance: Normal appearance. She is normal weight. She is not diaphoretic.   HENT:      Head: Normocephalic.      Right Ear: Tympanic membrane normal.      Left Ear: Tympanic membrane normal.      Nose: Nose normal.      Mouth/Throat:      Mouth: Mucous membranes are moist.      Pharynx: Oropharynx is clear.   Eyes:      Pupils: Pupils are equal, round, and reactive to light.   Cardiovascular:      Rate and Rhythm: Normal rate and regular rhythm.   Pulmonary:      Breath sounds: Normal breath sounds.   Abdominal:      General: Bowel sounds are normal.      Palpations: Abdomen is soft.   Musculoskeletal:         General: Normal range of motion.      Cervical back: Normal range of motion and neck supple.   Skin:     General: Skin is warm and dry.   Neurological:      Mental Status: She is alert.   Psychiatric:         Mood and Affect: Mood normal.         Behavior: Behavior normal.          Thought Content: Thought content normal.         Judgment: Judgment normal.   Health Maintenance:  Health Maintenance   Topic Date Due    Hepatitis C screening  Never done    Depression Screening  Never done    Adult Tdap-Td (1 - Tdap) Never done    Shingles Vaccine (1 of 2) Never done    Pneumococcal Vaccination, Age 65+ (2 - PCV) 11/15/2019    Covid-19 Vaccine (3 - Pfizer series) 03/18/2020    Annual Wellness Visit  Never done    Influenza Vaccine (1) 07/15/2022    Mammography-Unilateral  12/03/2023    Colonoscopy  11/04/2027    Osteoporosis screening  12/03/2031    Meningococcal Vaccine  Aged Out        Assessment:    ICD-10-CM    1. Encounter for screening for malignant neoplasm of breast, unspecified screening modality  Z12.39 MAMMO BILATERAL SCREENING      2. Hypothyroidism, unspecified type  E03.9 THYROID STIMULATING HORMONE WITH FREE T4 REFLEX      3. Iron deficiency anemia secondary to inadequate dietary iron intake  D50.8 CBC/DIFF      4. Hyperlipidemia, unspecified hyperlipidemia type  E78.5 LIPID PANEL     COMPREHENSIVE METABOLIC PNL, FASTING           Plan:  Will obtain labs.  Mammogram order written. Continue present medications.     Orders Placed This Encounter    MAMMO BILATERAL SCREENING    CBC/DIFF  LIPID PANEL    THYROID STIMULATING HORMONE WITH FREE T4 REFLEX    COMPREHENSIVE METABOLIC PNL, FASTING              Follow up:  In 6 months.     This note was partially created using voice recognition software and is inherently subject to errors including those of syntax and "sound alike " substitutions which may escape proof reading.  In such instances, original meaning may be extrapolated by contextual derivation.    [address] [place], FNP-BC  08/03/2022, 08:46

## 2022-08-04 ENCOUNTER — Other Ambulatory Visit (INDEPENDENT_AMBULATORY_CARE_PROVIDER_SITE_OTHER): Payer: Self-pay | Admitting: Family

## 2022-08-04 ENCOUNTER — Encounter (INDEPENDENT_AMBULATORY_CARE_PROVIDER_SITE_OTHER): Payer: Self-pay | Admitting: Family

## 2022-08-04 DIAGNOSIS — D0511 Intraductal carcinoma in situ of right breast: Secondary | ICD-10-CM

## 2022-08-29 ENCOUNTER — Other Ambulatory Visit (INDEPENDENT_AMBULATORY_CARE_PROVIDER_SITE_OTHER): Payer: Self-pay | Admitting: Family

## 2022-08-29 MED ORDER — OMEPRAZOLE 20 MG CAPSULE,DELAYED RELEASE
20.0000 mg | DELAYED_RELEASE_CAPSULE | Freq: Every morning | ORAL | 1 refills | Status: AC
Start: 2022-08-29 — End: 2022-11-27

## 2022-09-06 ENCOUNTER — Other Ambulatory Visit: Payer: Self-pay

## 2022-09-06 ENCOUNTER — Ambulatory Visit (INDEPENDENT_AMBULATORY_CARE_PROVIDER_SITE_OTHER): Payer: Medicare Other

## 2022-09-06 VITALS — BP 130/84 | HR 84

## 2022-09-06 DIAGNOSIS — M25559 Pain in unspecified hip: Secondary | ICD-10-CM

## 2022-09-06 MED ORDER — DEXAMETHASONE SODIUM PHOSPHATE 4 MG/ML INJECTION SOLUTION
8.0000 mg | INTRAMUSCULAR | Status: AC
Start: 2022-09-06 — End: 2022-09-06
  Administered 2022-09-06: 8 mg via INTRAMUSCULAR

## 2022-09-06 NOTE — Progress Notes (Signed)
Pt tolerated well.   Alejos Reinhardt [place], MA

## 2022-09-14 ENCOUNTER — Ambulatory Visit (INDEPENDENT_AMBULATORY_CARE_PROVIDER_SITE_OTHER): Payer: Medicare Other | Admitting: Internal Medicine

## 2022-09-14 ENCOUNTER — Encounter (INDEPENDENT_AMBULATORY_CARE_PROVIDER_SITE_OTHER): Payer: Self-pay | Admitting: Internal Medicine

## 2022-09-14 ENCOUNTER — Other Ambulatory Visit: Payer: Self-pay

## 2022-09-14 VITALS — BP 124/68 | HR 78 | Resp 18 | Ht 63.0 in | Wt 143.0 lb

## 2022-09-14 DIAGNOSIS — M25552 Pain in left hip: Secondary | ICD-10-CM

## 2022-09-14 DIAGNOSIS — D0511 Intraductal carcinoma in situ of right breast: Secondary | ICD-10-CM

## 2022-09-14 DIAGNOSIS — E039 Hypothyroidism, unspecified: Secondary | ICD-10-CM

## 2022-09-14 DIAGNOSIS — M25559 Pain in unspecified hip: Secondary | ICD-10-CM

## 2022-09-14 MED ORDER — LIDOCAINE (PF) 10 MG/ML (1 %) INJECTION SOLUTION
2.0000 mL | Freq: Once | INTRAMUSCULAR | Status: AC
Start: 2022-09-14 — End: 2022-09-14
  Administered 2022-09-14: 4 mg via INTRAMUSCULAR

## 2022-09-14 MED ORDER — TRIAMCINOLONE ACETONIDE 40 MG/ML SUSPENSION FOR INJECTION
40.0000 mg | INTRAMUSCULAR | Status: AC
Start: 2022-09-14 — End: 2022-09-14
  Administered 2022-09-14: 40 mg via INTRA_ARTICULAR

## 2022-09-14 NOTE — Procedures (Signed)
[facility]  [address]  [name] [id number]    Procedure Note    Name: Katherine [name] MRN:  [id number]   Date: 09/14/2022 Age: 71 y.o.  DOB:   07-Nov-1951       Large Joint Arthrocentesis: L greater trochanteric bursa  Details: 22 G needle, lateral approach  Outcome: tolerated well, no immediate complications  Procedure, treatment alternatives, risks and benefits explained, specific risks discussed. Immediately prior to procedure a time out was called to verify the correct patient, procedure, equipment, support staff and site/side marked as required. Patient was prepped and draped in the usual sterile fashion.           Nettie Elm, DO

## 2022-09-14 NOTE — Progress Notes (Signed)
[facility]  [address]  [place] [address]       Name: Katherine [name] MRN:  [id number]   Date: 09/14/2022 Age: 71 y.o.       Chief Complaint:    Chief Complaint   Patient presents with    Joint Injection     Wanting Lt hip injection--        HPI:  Katherine [name] is a 71 y.o. female who presents to the office today for follow-up.  Patient is actually doing relatively well and except for minor issues as described below which I have addressed in total with the patient.        Past Medical History:  Past Medical History:   Diagnosis Date    Anemia     B12 deficiency     Esophageal reflux     Essential hypertension     Hx of breast cancer     Hx of transfusion     Hypercholesterolemia     Osteoporosis     Thyroid disease     Vitamin D deficiency          Past Surgical History:   Procedure Laterality Date    HX BREAST LUMPECTOMY      HX COLONOSCOPY      HX RADICAL MASTECTOMY      HX TUBAL LIGATION        Current Outpatient Medications   Medication Sig    albuterol sulfate (PROVENTIL OR VENTOLIN OR PROAIR) 90 mcg/actuation Inhalation oral inhaler Take 1-2 Puffs by inhalation Every 6 hours as needed    cyanocobalamin (VITAMIN B12) 2,500 mcg Sublingual Tablet, Sublingual Place 1 Tablet (2,500 mcg total) under the tongue Once a day    ergocalciferol, vitamin D2, (DRISDOL) 1,250 mcg (50,000 unit) Oral Capsule Take 1 Capsule (50,000 Units total) by mouth Every 7 days    Ibuprofen (MOTRIN) 800 mg Oral Tablet TAKE 1 TABLET BY MOUTH ONCE DAILY AS NEEDED WITH FOOD    levothyroxine (SYNTHROID) 75 mcg Oral Tablet Take 1 Tablet (75 mcg total) by mouth Every morning    lisinopriL (PRINIVIL) 10 mg Oral Tablet 1 Tablet (10 mg total) Once a day Takes 1.5 tables  so she has been doing 15 mg.    montelukast (SINGULAIR) 10 mg Oral Tablet Take 1 Tablet (10 mg total) by mouth Once a day    moxifloxacin (VIGAMOX) 0.5 % Ophthalmic Drops INSTILL 1 DROP INTO LEFT EYE 4 TIMES DAILY, START 1 DAY PRIOR  TO SURGERY    omeprazole (PRILOSEC) 20 mg Oral Capsule, Delayed Release(E.C.) Take 1 Capsule (20 mg total) by mouth Every morning for 90 days     Allergies   Allergen Reactions    Boniva [Ibandronate]     Lipitor [Atorvastatin]        Family History:  Family Medical History:       Problem Relation (Age of Onset)    Blood Clots Daughter    Hypertension (High Blood Pressure) Mother    Lung Cancer Father    Thyroid Disease Mother              Social History:   Social History     Tobacco Use   Smoking Status Former    Types: Cigarettes   Smokeless Tobacco Never     Social History     Substance and Sexual Activity   Alcohol Use Never     Social History     Occupational  History    Not on file       Review of Systems:  Review of systems as discussed in HPI    Problem List:  Patient Active Problem List   Diagnosis    Ductal carcinoma in situ (DCIS) of right breast    Iron deficiency anemia secondary to inadequate dietary iron intake    Paget's disease of female breast, right (CMS HCC)    Hypothyroidism    GERD (gastroesophageal reflux disease)       Physical Examination:  BP 124/68 (Site: Left, Patient Position: Sitting, Cuff Size: Adult)   Pulse 78   Resp 18   Ht 1.6 m ('5\' 3"'$ )   Wt 64.9 kg (143 lb)   SpO2 99%   BMI 25.33 kg/m       Physical Exam  Vitals and nursing note reviewed.   Constitutional:       General: She is not in acute distress.     Appearance: Normal appearance.   HENT:      Head: Normocephalic and atraumatic.      Right Ear: Tympanic membrane normal.      Left Ear: Tympanic membrane normal.      Nose: Nose normal.      Mouth/Throat:      Mouth: Mucous membranes are moist.      Pharynx: Oropharynx is clear.   Eyes:      General:         Right eye: No discharge.         Left eye: No discharge.      Conjunctiva/sclera: Conjunctivae normal.      Pupils: Pupils are equal, round, and reactive to light.   Cardiovascular:      Rate and Rhythm: Normal rate and regular rhythm.      Pulses:           Carotid  pulses are 0 on the right side and 0 on the left side.       Popliteal pulses are 2+ on the right side and 2+ on the left side.        Dorsalis pedis pulses are 2+ on the right side and 2+ on the left side.      Heart sounds: Normal heart sounds.   Pulmonary:      Effort: Pulmonary effort is normal.      Breath sounds: Normal breath sounds. No wheezing, rhonchi or rales.   Abdominal:      General: Bowel sounds are normal. There is no distension.      Palpations: Abdomen is soft.      Tenderness: There is no abdominal tenderness. There is no rebound.   Musculoskeletal:         General: No tenderness or signs of injury. Normal range of motion.      Cervical back: Normal range of motion. No tenderness.   Lymphadenopathy:      Cervical: No cervical adenopathy.   Skin:     General: Skin is warm.      Capillary Refill: Capillary refill takes less than 2 seconds.      Coloration: Skin is not jaundiced.   Neurological:      General: No focal deficit present.      Mental Status: She is alert and oriented to person, place, and time.      Cranial Nerves: No cranial nerve deficit.      Motor: No weakness.   Psychiatric:         Behavior:  Behavior normal.              Health Maintenance:  Health Maintenance   Topic Date Due    Hepatitis C screening  Never done    Adult Tdap-Td (1 - Tdap) Never done    Shingles Vaccine (1 of 2) Never done    Pneumococcal Vaccination, Age 46+ (2 - PCV) 11/15/2019    Medicare Annual Wellness Visit  Never done    Influenza Vaccine (1) 07/15/2022    Covid-19 Vaccine (3 - 2023-24 season) 07/15/2022    Depression Screening  09/15/2023    Mammography-Unilateral  12/03/2023    Colonoscopy  11/04/2027    Osteoporosis screening  12/03/2031    Meningococcal Vaccine  Aged Out        Assessment:      ICD-10-CM    1. Hip pain, unspecified laterality  M25.559 Large Joint Arthrocentesis: L greater trochanteric bursa     lidocaine PF (XYLOCAINE-MPF) 1% injection     triamcinolone acetonide (KENALOG-40) 40 mg/mL  injection      2. Acquired hypothyroidism  E03.9       3. Ductal carcinoma in situ (DCIS) of right breast  D05.11              Orders Placed This Encounter    Large Joint Arthrocentesis: L greater trochanteric bursa    lidocaine PF (XYLOCAINE-MPF) 1% injection    triamcinolone acetonide (KENALOG-40) 40 mg/mL injection       Depression screening is negative. PHQ 2 Total: 0          Follow up:  No follow-ups on file.    This note was partially created using voice recognition software and is inherently subject to errors including those of syntax and "sound alike " substitutions which may escape proof reading.  In such instances, original meaning may be extrapolated by contextual derivation.    Nettie Elm, DO  09/14/2022, 14:25

## 2022-09-27 ENCOUNTER — Ambulatory Visit (INDEPENDENT_AMBULATORY_CARE_PROVIDER_SITE_OTHER): Payer: Medicare Other | Admitting: Family

## 2022-09-27 ENCOUNTER — Other Ambulatory Visit (HOSPITAL_COMMUNITY): Payer: Self-pay

## 2022-09-27 ENCOUNTER — Other Ambulatory Visit: Payer: Self-pay

## 2022-09-27 ENCOUNTER — Encounter (INDEPENDENT_AMBULATORY_CARE_PROVIDER_SITE_OTHER): Payer: Self-pay | Admitting: Family

## 2022-09-27 VITALS — BP 128/64 | HR 80 | Wt 141.2 lb

## 2022-09-27 DIAGNOSIS — M549 Dorsalgia, unspecified: Secondary | ICD-10-CM

## 2022-09-27 DIAGNOSIS — M25559 Pain in unspecified hip: Secondary | ICD-10-CM

## 2022-09-27 DIAGNOSIS — Z13220 Encounter for screening for lipoid disorders: Secondary | ICD-10-CM

## 2022-09-27 DIAGNOSIS — E039 Hypothyroidism, unspecified: Secondary | ICD-10-CM

## 2022-09-27 DIAGNOSIS — I10 Essential (primary) hypertension: Secondary | ICD-10-CM

## 2022-09-27 LAB — LIPID PANEL
CHOLESTEROL: 210
HDL-CHOLESTEROL: 79
LDL (CALCULATED): 110
TRIGLYCERIDES: 120
VLDL (CALCULATED): 21

## 2022-09-27 IMAGING — DX XRAY HIP W/PELVIS UNIL 2-3 VIEWS
2 series · 2 of 2 positions shown · non-contrast
Comparison: None available.
COMPARISON: None available.

------------- REPORT [id number] -------------
﻿EXAM:  [id number]      XRAY LUMBAR SPINE MINIMUM 4 VIEWS
INDICATION: Left hip pain with left leg and toe numbness.
TECHNIQUE: Five views are submitted.
INDICATION: Left hip pain, low back pain.  No history of surgery or trauma..

[AP]
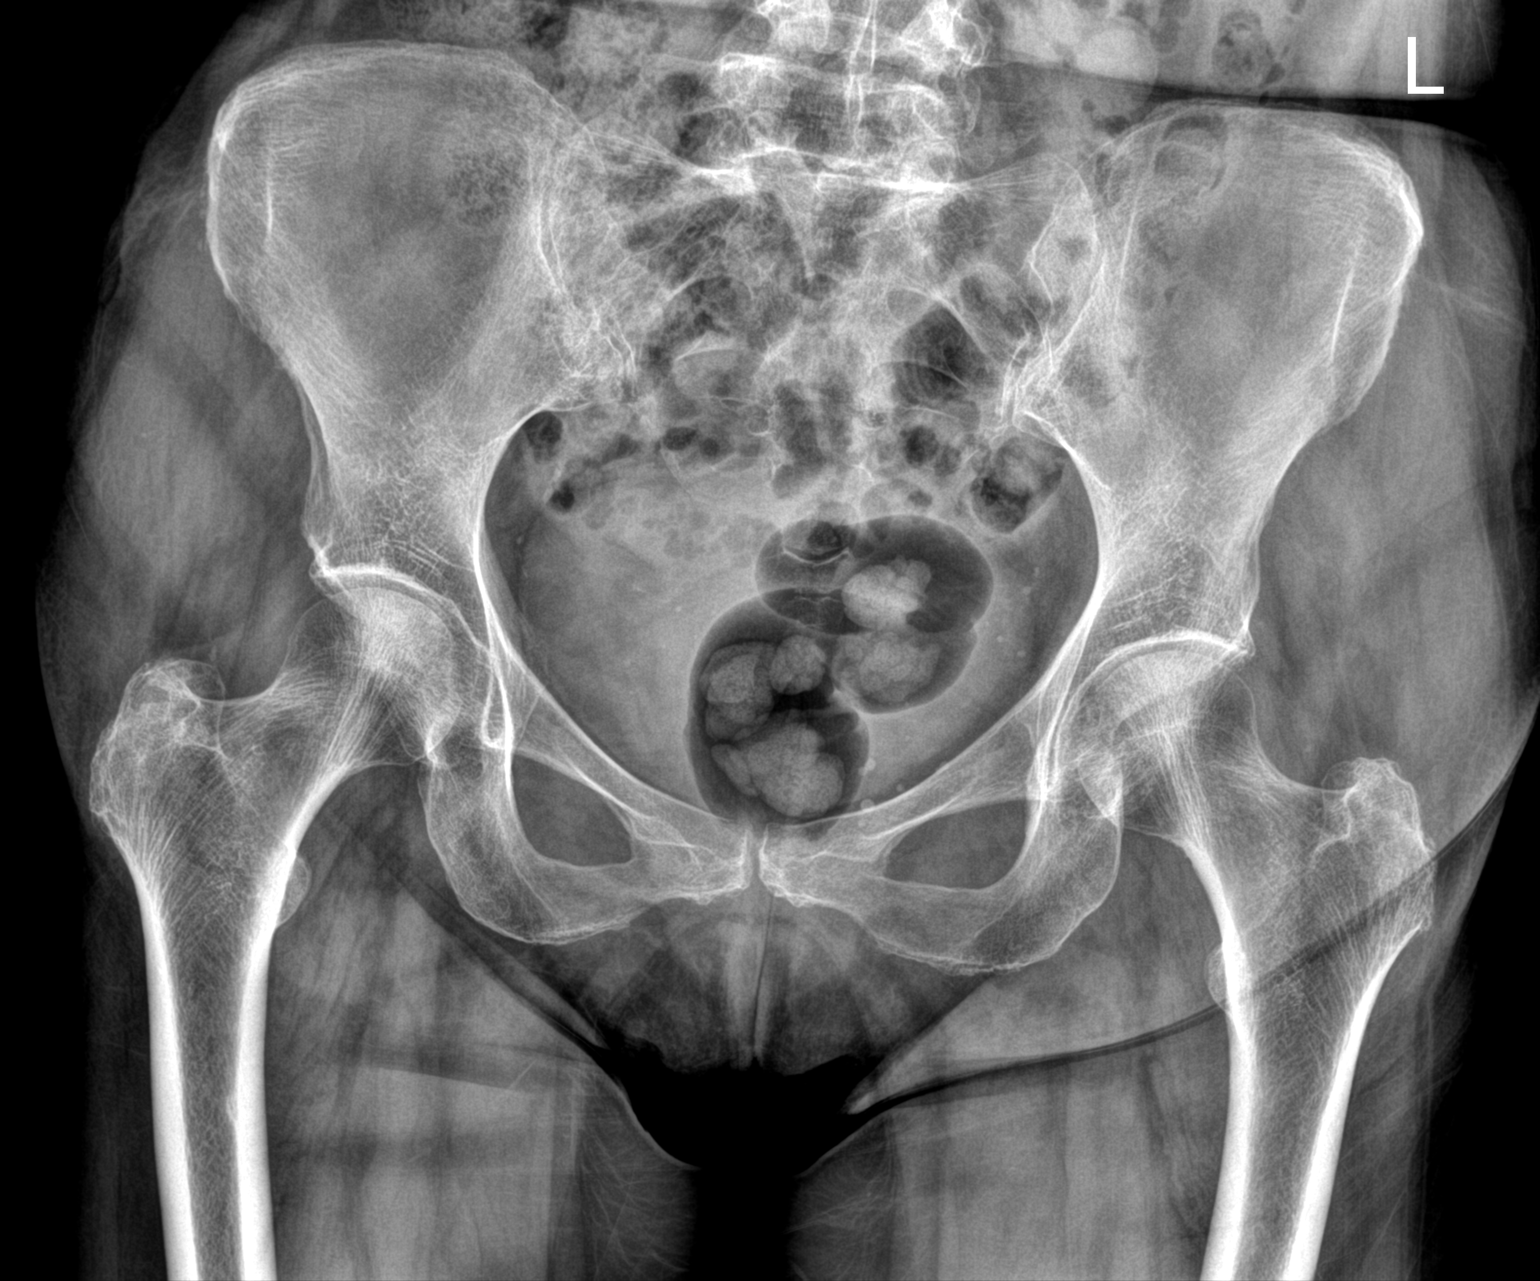

[lateral]
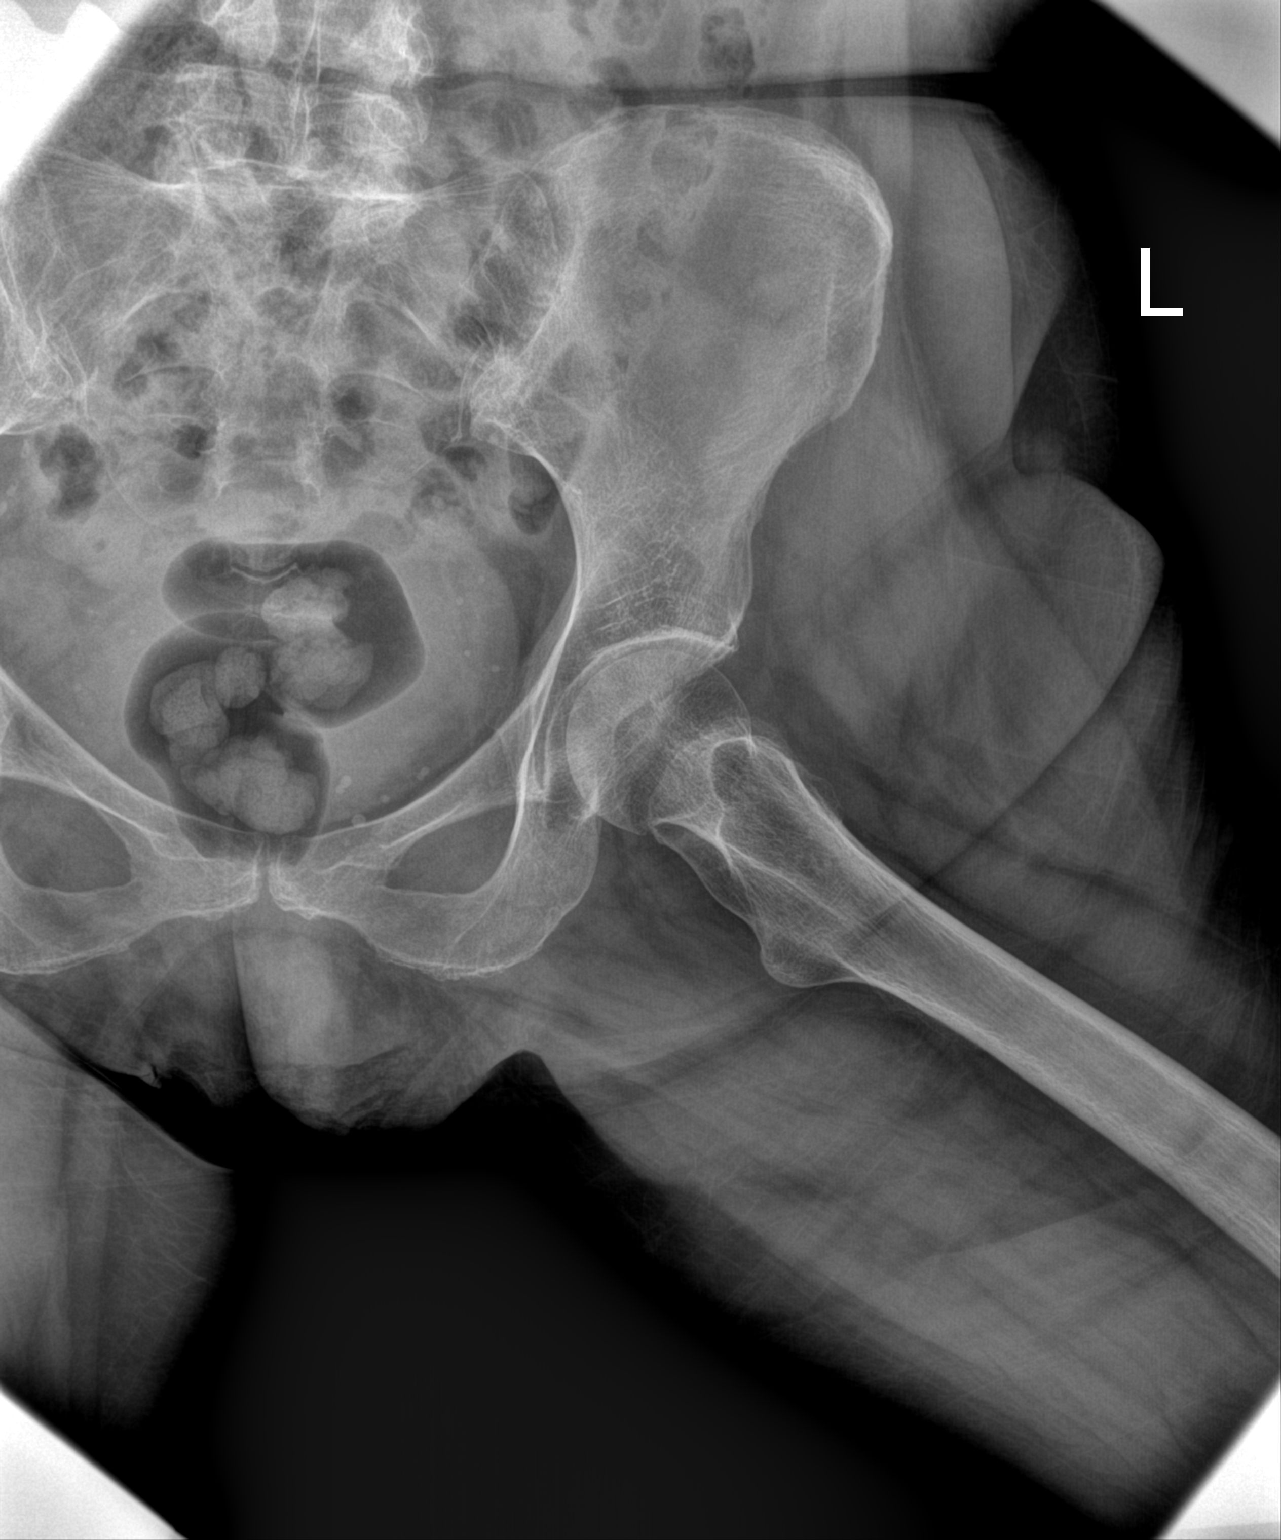

[2 of 2 positions shown; findings below may reference images not displayed]

FINDINGS: There are moderately severe degenerative changes at multiple levels with disc space narrowing, endplate sclerosis, osteophyte formation, and vacuum disc phenomena.  Mild subluxation at L3-L4 and L5-S1 is compatible with degenerative change.  There is mild levoconvex scoliosis that somewhat limits this examination in terminates of optimal positioning.  There is a mild L2 compression deformity of indeterminate age.  Vascular calcification is noted.
IMPRESSION: 1. Moderately severe degenerative changes.  

2. Mild L1 compression deformity of indeterminate age.

------------- REPORT [id number] -------------
﻿EXAM:  [DATE]      XRAY HIP W/PELVIS UNIL 2-3 VIEWS,XRAY LUMBAR SPINE MINIMUM 4 VIEWS
FINDINGS: No focal bone changes of the pelvic bones.  Hip joint spaces show mild osteoarthritis. Moderate arthritic changes of sacroiliac joints.

Diffuse osteopenia of vertebrae are noted.  Severe degenerative disc changes are noted at L1-2, L2-3, L3-4 and L5-S1 levels.  Mild anterolisthesis of L5 on S1 and retrolisthesis of L3 on L4 are noted.  Mild compression of upper endplates of L1 and L5 vertebrae are noted.
IMPRESSION: 1.  No plain radiographic abnormalities of the pelvis and hips other than arthritic changes as described above. 

2. Osteopenic bones.  Please correlate with DEXA densitometry.

3. Mild compression of upper endplates of L1 and L5 vertebrae of undetermined age.  If symptoms are localized and persistent further evaluation with MRI is recommended. 

4. Severe multilevel degenerative disc changes and listhesis as described above.

## 2022-09-27 IMAGING — DX XRAY LUMBAR SPINE MINIMUM 4 VIEWS
1 series · 5 of 5 positions shown · non-contrast
Comparison: None available.
COMPARISON: None available.

------------- REPORT [id number] -------------
﻿EXAM:  [id number]      XRAY LUMBAR SPINE MINIMUM 4 VIEWS
INDICATION: Left hip pain with left leg and toe numbness.
TECHNIQUE: Five views are submitted.
INDICATION: Left hip pain, low back pain.  No history of surgery or trauma..

[Series 1: AP · 0.14mm/px · 5 of 5 slices shown]
[im 1/5]
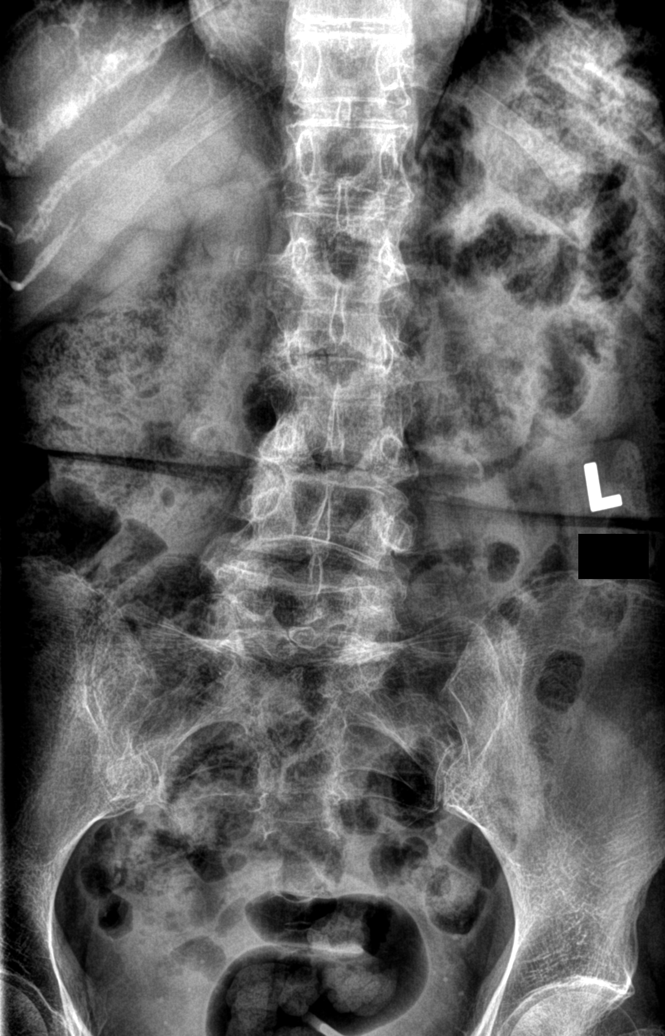
[im 2/5]
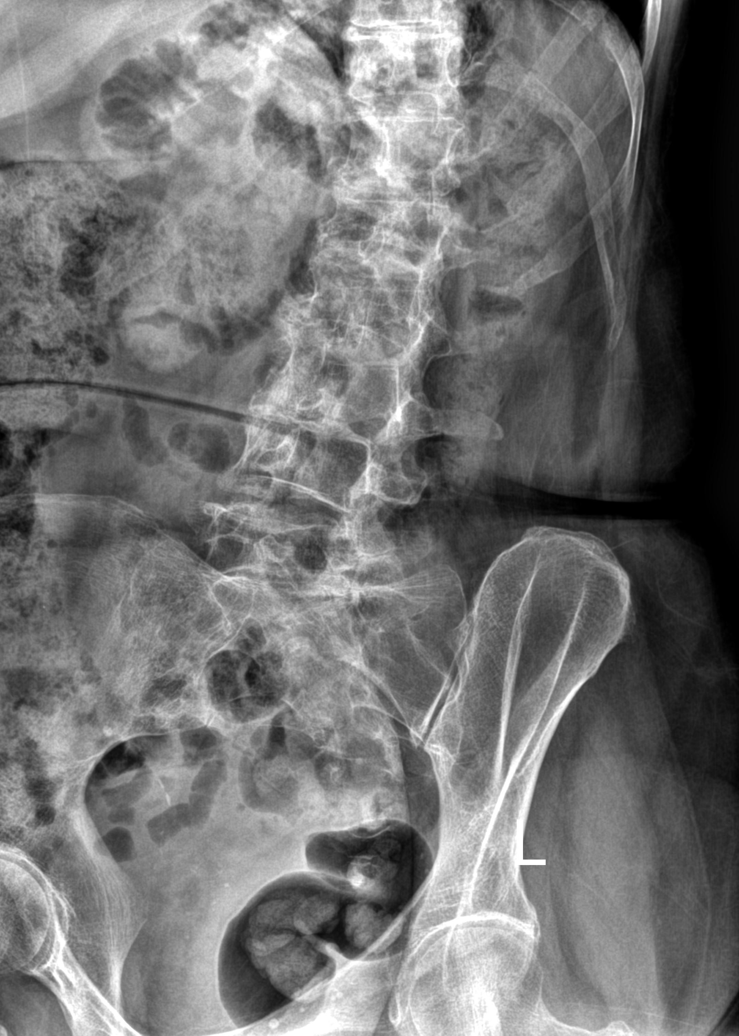
[im 3/5]
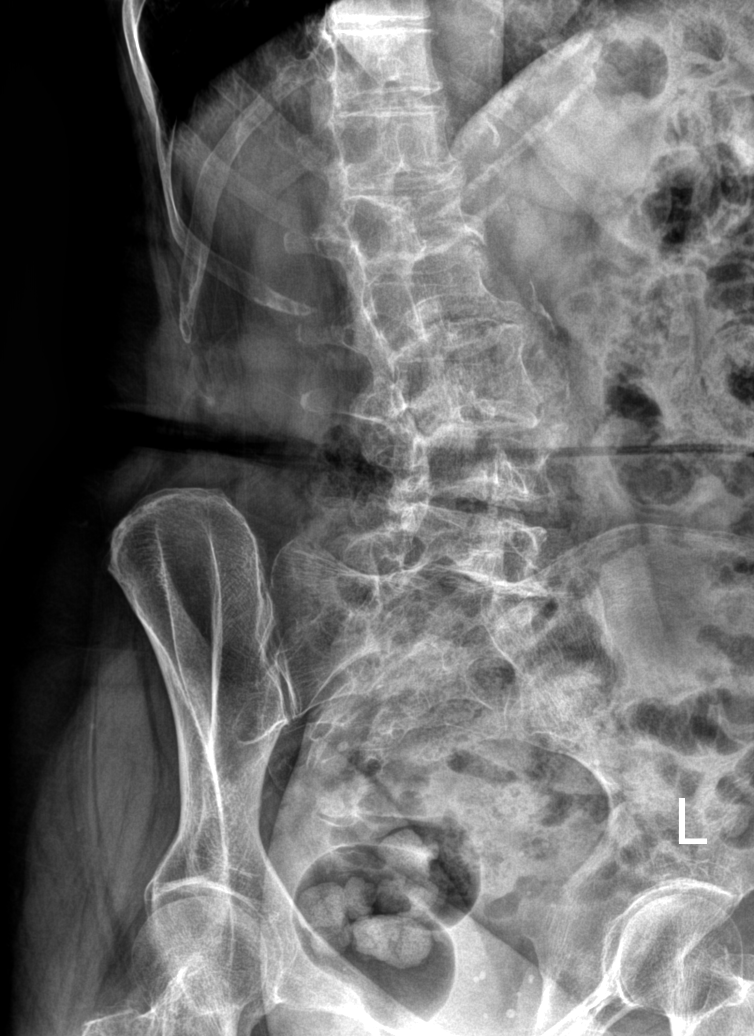
[im 4/5]
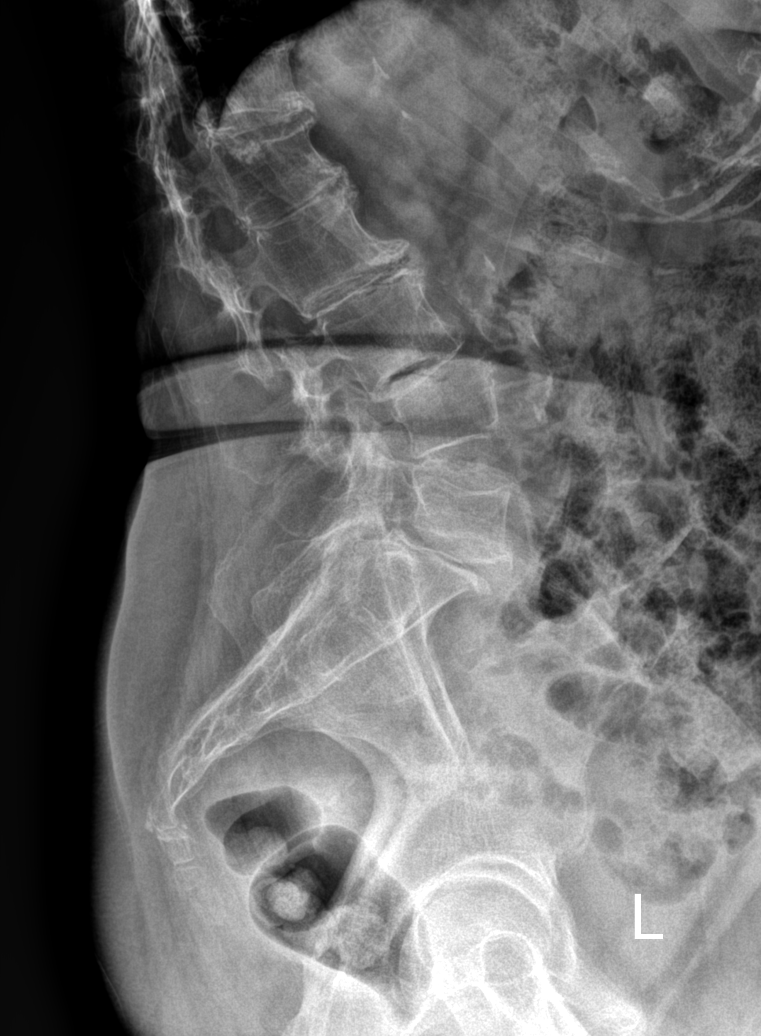
[im 5/5]
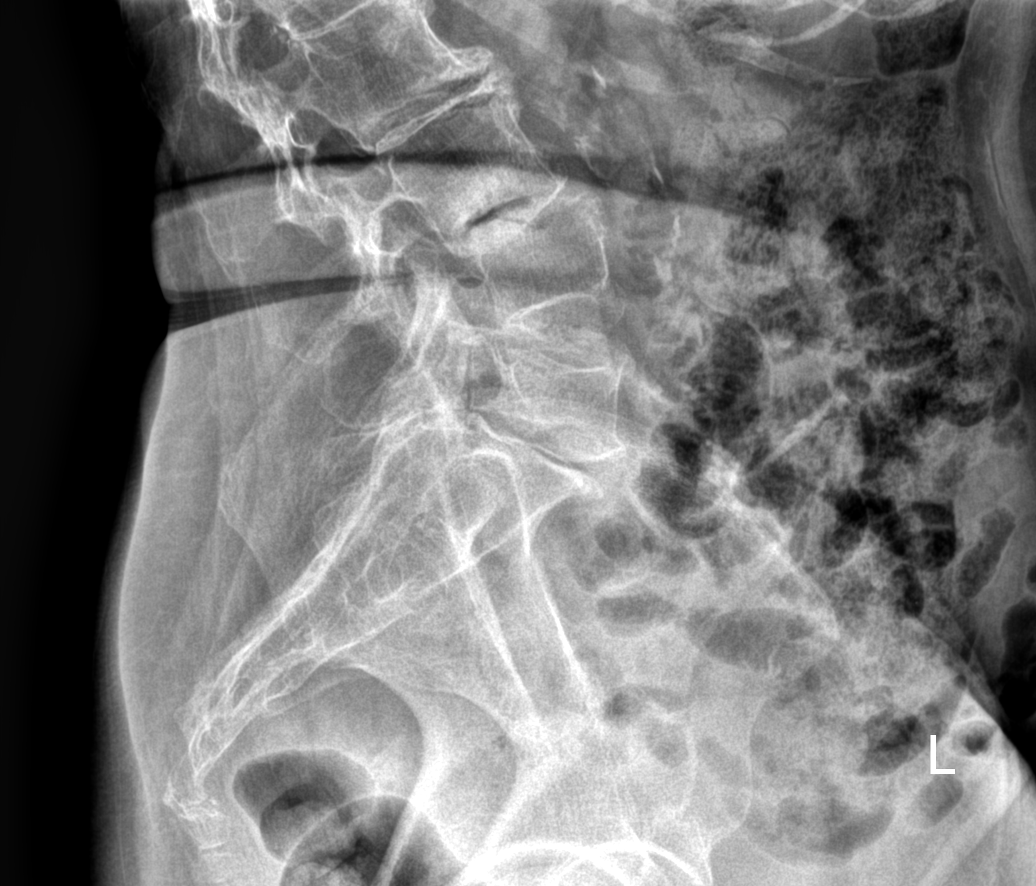

[5 of 5 positions shown; findings below may reference images not displayed]

FINDINGS: There are moderately severe degenerative changes at multiple levels with disc space narrowing, endplate sclerosis, osteophyte formation, and vacuum disc phenomena.  Mild subluxation at L3-L4 and L5-S1 is compatible with degenerative change.  There is mild levoconvex scoliosis that somewhat limits this examination in terminates of optimal positioning.  There is a mild L2 compression deformity of indeterminate age.  Vascular calcification is noted.
IMPRESSION: 1. Moderately severe degenerative changes.  

2. Mild L1 compression deformity of indeterminate age.

------------- REPORT [id number] -------------
﻿EXAM:  [DATE]      XRAY HIP W/PELVIS UNIL 2-3 VIEWS,XRAY LUMBAR SPINE MINIMUM 4 VIEWS
FINDINGS: No focal bone changes of the pelvic bones.  Hip joint spaces show mild osteoarthritis. Moderate arthritic changes of sacroiliac joints.

Diffuse osteopenia of vertebrae are noted.  Severe degenerative disc changes are noted at L1-2, L2-3, L3-4 and L5-S1 levels.  Mild anterolisthesis of L5 on S1 and retrolisthesis of L3 on L4 are noted.  Mild compression of upper endplates of L1 and L5 vertebrae are noted.
IMPRESSION: 1.  No plain radiographic abnormalities of the pelvis and hips other than arthritic changes as described above. 

2. Osteopenic bones.  Please correlate with DEXA densitometry.

3. Mild compression of upper endplates of L1 and L5 vertebrae of undetermined age.  If symptoms are localized and persistent further evaluation with MRI is recommended. 

4. Severe multilevel degenerative disc changes and listhesis as described above.

## 2022-09-27 MED ORDER — DEXAMETHASONE SODIUM PHOSPHATE 4 MG/ML INJECTION SOLUTION
8.0000 mg | INTRAMUSCULAR | Status: AC
Start: 2022-09-27 — End: 2022-09-27
  Administered 2022-09-27: 8 mg via INTRAMUSCULAR

## 2022-09-27 MED ORDER — AMOXICILLIN 875 MG-POTASSIUM CLAVULANATE 125 MG TABLET
1.0000 | ORAL_TABLET | Freq: Two times a day (BID) | ORAL | 0 refills | Status: DC
Start: 2022-09-27 — End: 2022-10-07

## 2022-09-27 MED ORDER — PROMETHAZINE 6.25 MG/5 ML ORAL SYRUP
6.2500 mg | ORAL_SOLUTION | Freq: Four times a day (QID) | ORAL | 0 refills | Status: DC | PRN
Start: 2022-09-27 — End: 2022-11-09

## 2022-09-27 MED ORDER — LIDOCAINE HCL 10 MG/ML (1 %) INJECTION SOLUTION
1.0000 g | INTRAMUSCULAR | Status: AC
Start: 2022-09-27 — End: 2022-09-27
  Administered 2022-09-27: 1 g via INTRAMUSCULAR

## 2022-09-27 MED ORDER — PREDNISONE 20 MG TABLET
20.0000 mg | ORAL_TABLET | Freq: Every day | ORAL | 0 refills | Status: DC
Start: 2022-09-27 — End: 2022-11-09

## 2022-09-27 NOTE — Progress Notes (Signed)
[facility], [facility]  [address]  [place] [address]       Name: Katherine [name] MRN:  [id number]   Date: 09/27/2022 Age: 71 y.o.       Chief Complaint:    Chief Complaint   Patient presents with    Hip Joint Pain     Pt c/o right hip pain. Said she has gotten steroids, and a hip injection. [name] nothing is helping. Would like to get a x-ray and see what is going on. Also c/o chest congestion,also believes her iron is low. And has bruises on her body and she don't know why.         HPI:  Katherine [name] is a 71 y.o. female who present with complaints of low back pain and left hip pain.  She is some better when she takes motrin 800 mgs daily but not completely.  She is having wheezing and nasal congestion.  Feels like her iron is low.        Past Medical History:  Past Medical History:   Diagnosis Date    Anemia     B12 deficiency     Esophageal reflux     Essential hypertension     Hx of breast cancer     Hx of transfusion     Hypercholesterolemia     Osteoporosis     Thyroid disease     Vitamin D deficiency          Past Surgical History:   Procedure Laterality Date    HX BREAST LUMPECTOMY      HX COLONOSCOPY      HX RADICAL MASTECTOMY      HX TUBAL LIGATION        Current Outpatient Medications   Medication Sig    albuterol sulfate (PROVENTIL OR VENTOLIN OR PROAIR) 90 mcg/actuation Inhalation oral inhaler Take 1-2 Puffs by inhalation Every 6 hours as needed    amoxicillin-pot clavulanate (AUGMENTIN) 875-125 mg Oral Tablet Take 1 Tablet by mouth Twice daily    cyanocobalamin (VITAMIN B12) 2,500 mcg Sublingual Tablet, Sublingual Place 1 Tablet (2,500 mcg total) under the tongue Once a day    ergocalciferol, vitamin D2, (DRISDOL) 1,250 mcg (50,000 unit) Oral Capsule Take 1 Capsule (50,000 Units total) by mouth Every 7 days    Ibuprofen (MOTRIN) 800 mg Oral Tablet TAKE 1 TABLET BY MOUTH ONCE DAILY AS NEEDED WITH FOOD    levothyroxine (SYNTHROID) 75 mcg Oral Tablet Take 1 Tablet (75  mcg total) by mouth Every morning    lisinopriL (PRINIVIL) 10 mg Oral Tablet 1 Tablet (10 mg total) Once a day Takes 1.5 tables  so she has been doing 15 mg.    montelukast (SINGULAIR) 10 mg Oral Tablet Take 1 Tablet (10 mg total) by mouth Once a day    moxifloxacin (VIGAMOX) 0.5 % Ophthalmic Drops INSTILL 1 DROP INTO LEFT EYE 4 TIMES DAILY, START 1 DAY PRIOR TO SURGERY    omeprazole (PRILOSEC) 20 mg Oral Capsule, Delayed Release(E.C.) Take 1 Capsule (20 mg total) by mouth Every morning for 90 days    predniSONE (DELTASONE) 20 mg Oral Tablet Take 1 Tablet (20 mg total) by mouth Once a day    promethazine (PHENERGAN) 6.25 mg/5 mL Oral Syrup Take 5 mL (6.25 mg total) by mouth Every 6 hours as needed for Nausea/Vomiting     Allergies   Allergen Reactions    Boniva [Ibandronate]     Lipitor [Atorvastatin]  Family History:  Family Medical History:       Problem Relation (Age of Onset)    Blood Clots Daughter    Hypertension (High Blood Pressure) Mother    Lung Cancer Father    Thyroid Disease Mother              Social History:   Social History     Tobacco Use   Smoking Status Former    Types: Cigarettes   Smokeless Tobacco Never     Social History     Substance and Sexual Activity   Alcohol Use Never     Social History     Occupational History    Not on file       Review of Systems:  Review of systems as discussed in HPI    Problem List:  Patient Active Problem List   Diagnosis    Ductal carcinoma in situ (DCIS) of right breast    Iron deficiency anemia secondary to inadequate dietary iron intake    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     Appearance: Normal appearance. She is normal weight. She is not diaphoretic.   HENT:      Head: Normocephalic.       Right Ear: Tympanic membrane normal.      Left Ear: Tympanic membrane normal.      Nose: Nose normal.      Mouth/Throat:      Mouth: Mucous membranes are moist.      Pharynx: Oropharynx is clear.   Eyes:      Pupils: Pupils are equal, round, and reactive to light.   Cardiovascular:      Rate and Rhythm: Normal rate and regular rhythm.   Pulmonary:      Breath sounds: Normal breath sounds.   Abdominal:      General: Bowel sounds are normal.      Palpations: Abdomen is soft.   Musculoskeletal:         General: Normal range of motion.      Cervical back: Normal range of motion and neck supple.   Skin:     General: Skin is warm and dry.   Neurological:      Mental Status: She is alert.   Psychiatric:         Mood and Affect: Mood normal.         Behavior: Behavior normal.         Thought Content: Thought content normal.         Judgment: Judgment normal.     Health Maintenance:  Health Maintenance   Topic Date Due    Hepatitis C screening  Never done    Adult Tdap-Td (1 - Tdap) Never done    Shingles Vaccine (1 of 2) Never done    Pneumococcal Vaccination, Age 74+ (2 - PCV) 11/15/2019    Medicare Annual Wellness Visit  Never done    Influenza Vaccine (1) 07/15/2022    Covid-19 Vaccine (3 - 2023-24 season) 07/15/2022    Depression Screening  09/15/2023    Mammography-Unilateral  12/03/2023  Colonoscopy  11/04/2027    Osteoporosis screening  12/03/2031    Meningococcal Vaccine  Aged Out        Assessment:    ICD-10-CM    1. Hypertension, unspecified type  I10 CBC/DIFF     COMPREHENSIVE METABOLIC PNL, FASTING      2. Hypothyroidism, unspecified type  E03.9 LIPID PANEL      3. Screening for hyperlipidemia  Z13.220 THYROID STIMULATING HORMONE WITH FREE T4 REFLEX      4. Back pain, unspecified back location, unspecified back pain laterality, unspecified chronicity  M54.9 XR LUMBAR SPINE SERIES      5. Hip pain, unspecified laterality  M25.559 XR HIP LEFT W PELVIS 2-3 VIEWS           Plan:  Will obtain labs and xrays.      Orders Placed This Encounter    XR LUMBAR SPINE SERIES    XR HIP LEFT W PELVIS 2-3 VIEWS    CBC/DIFF    COMPREHENSIVE METABOLIC PNL, FASTING    LIPID PANEL    THYROID STIMULATING HORMONE WITH FREE T4 REFLEX    cefTRIAXone (ROCEPHIN) 1 g in lidocaine 2.86 mL (tot vol) IM injection    dexAMETHasone 4 mg/mL injection    amoxicillin-pot clavulanate (AUGMENTIN) 875-125 mg Oral Tablet    predniSONE (DELTASONE) 20 mg Oral Tablet    promethazine (PHENERGAN) 6.25 mg/5 mL Oral Syrup              Follow up:  As scheduled    This note was partially created using voice recognition software and is inherently subject to errors including those of syntax and "sound alike " substitutions which may escape proof reading.  In such instances, original meaning may be extrapolated by contextual derivation.    [id number] [place], FNP-BC  09/27/2022, 09:07

## 2022-09-28 ENCOUNTER — Encounter (INDEPENDENT_AMBULATORY_CARE_PROVIDER_SITE_OTHER): Payer: Self-pay | Admitting: Family

## 2022-09-28 ENCOUNTER — Other Ambulatory Visit (INDEPENDENT_AMBULATORY_CARE_PROVIDER_SITE_OTHER): Payer: Self-pay | Admitting: Family

## 2022-09-28 DIAGNOSIS — E039 Hypothyroidism, unspecified: Secondary | ICD-10-CM

## 2022-09-28 DIAGNOSIS — D508 Other iron deficiency anemias: Secondary | ICD-10-CM

## 2022-09-28 DIAGNOSIS — Z13220 Encounter for screening for lipoid disorders: Secondary | ICD-10-CM

## 2022-09-28 DIAGNOSIS — I10 Essential (primary) hypertension: Secondary | ICD-10-CM

## 2022-09-29 ENCOUNTER — Telehealth (INDEPENDENT_AMBULATORY_CARE_PROVIDER_SITE_OTHER): Payer: Self-pay | Admitting: Family

## 2022-09-29 NOTE — Telephone Encounter (Signed)
PT CALLED AND DID NOT GET SCHEDULED WITH TRACEY WITH HEMA/ONC AT [facility] TILL THE END OF JANUARY. She SAID She CANNOT WAIT THAT LONG. She SAID WHERE She IS ANEMIC She IS SOB, WEAK IN LEGS She CAN HARDLY WALK. TINGLING IN ARMS, FEET AND HANDS. AND HAS SEEN [facility] IN THE PAST AND HAS HAD TO HAVE IRON TRANSFUSIONS. She ASKED WHAT COULD BE DONE SOONER.   THANKS MELISSA W

## 2022-10-03 ENCOUNTER — Encounter (INDEPENDENT_AMBULATORY_CARE_PROVIDER_SITE_OTHER): Payer: Self-pay | Admitting: Family

## 2022-10-03 ENCOUNTER — Other Ambulatory Visit (INDEPENDENT_AMBULATORY_CARE_PROVIDER_SITE_OTHER): Payer: Self-pay | Admitting: Family

## 2022-10-03 DIAGNOSIS — M549 Dorsalgia, unspecified: Secondary | ICD-10-CM

## 2022-10-03 DIAGNOSIS — S32000A Wedge compression fracture of unspecified lumbar vertebra, initial encounter for closed fracture: Secondary | ICD-10-CM

## 2022-10-03 DIAGNOSIS — M25559 Pain in unspecified hip: Secondary | ICD-10-CM

## 2022-10-04 ENCOUNTER — Other Ambulatory Visit (INDEPENDENT_AMBULATORY_CARE_PROVIDER_SITE_OTHER): Payer: Self-pay | Admitting: Family

## 2022-10-07 ENCOUNTER — Encounter (HOSPITAL_COMMUNITY): Payer: Self-pay | Admitting: NURSE PRACTITIONER

## 2022-10-07 ENCOUNTER — Ambulatory Visit: Payer: Medicare Other | Attending: NURSE PRACTITIONER | Admitting: NURSE PRACTITIONER

## 2022-10-07 ENCOUNTER — Other Ambulatory Visit: Payer: Self-pay

## 2022-10-07 ENCOUNTER — Other Ambulatory Visit (HOSPITAL_COMMUNITY): Payer: Medicare Other

## 2022-10-07 VITALS — BP 142/62 | HR 87 | Temp 96.5°F | Ht 63.0 in | Wt 144.8 lb

## 2022-10-07 DIAGNOSIS — D0511 Intraductal carcinoma in situ of right breast: Secondary | ICD-10-CM

## 2022-10-07 DIAGNOSIS — D649 Anemia, unspecified: Secondary | ICD-10-CM

## 2022-10-07 DIAGNOSIS — D508 Other iron deficiency anemias: Secondary | ICD-10-CM

## 2022-10-07 DIAGNOSIS — Z801 Family history of malignant neoplasm of trachea, bronchus and lung: Secondary | ICD-10-CM | POA: Insufficient documentation

## 2022-10-07 DIAGNOSIS — Z87891 Personal history of nicotine dependence: Secondary | ICD-10-CM | POA: Insufficient documentation

## 2022-10-07 DIAGNOSIS — C50011 Malignant neoplasm of nipple and areola, right female breast: Secondary | ICD-10-CM

## 2022-10-07 DIAGNOSIS — Z09 Encounter for follow-up examination after completed treatment for conditions other than malignant neoplasm: Secondary | ICD-10-CM | POA: Insufficient documentation

## 2022-10-07 DIAGNOSIS — Z86000 Personal history of in-situ neoplasm of breast: Secondary | ICD-10-CM | POA: Insufficient documentation

## 2022-10-07 LAB — IRON TRANSFERRIN AND TIBC
IRON (TRANSFERRIN) SATURATION: 4 % — ABNORMAL LOW (ref 15–50)
IRON: 20 ug/dL — ABNORMAL LOW (ref 50–212)
TOTAL IRON BINDING CAPACITY: 561 ug/dL — ABNORMAL HIGH (ref 250–450)
TRANSFERRIN: 401 mg/dL — ABNORMAL HIGH (ref 203–362)
UIBC: 541 ug/dL — ABNORMAL HIGH (ref 130–375)

## 2022-10-07 LAB — RETICULOCYTE COUNT
IMMATURE RETIC FRACTION: 0.42 (ref 0.29–0.53)
RETICULOCYTE % AUTOMATED: 1.72 % (ref 0.5–2.17)
RETICULOCYTES COUNT # AUTOMATED: 0.0693 10*6/uL (ref 0.0221–0.0963)

## 2022-10-07 LAB — CBC WITH DIFF
BASOPHIL #: 0.1 10*3/uL (ref 0.00–0.10)
BASOPHIL %: 1 % (ref 0–1)
EOSINOPHIL #: 0.4 10*3/uL (ref 0.00–0.50)
EOSINOPHIL %: 4 %
HCT: 28.9 % — ABNORMAL LOW (ref 31.2–41.9)
HGB: 9.1 g/dL — ABNORMAL LOW (ref 10.9–14.3)
LYMPHOCYTE #: 2 10*3/uL (ref 1.00–3.00)
LYMPHOCYTE %: 20 % (ref 16–44)
MCH: 22.7 pg — ABNORMAL LOW (ref 24.7–32.8)
MCHC: 31.6 g/dL — ABNORMAL LOW (ref 32.3–35.6)
MCV: 71.8 fL — ABNORMAL LOW (ref 75.5–95.3)
MONOCYTE #: 0.8 10*3/uL (ref 0.30–1.00)
MONOCYTE %: 8 % (ref 5–13)
MPV: 7.2 fL — ABNORMAL LOW (ref 7.9–10.8)
NEUTROPHIL #: 6.8 10*3/uL (ref 1.85–7.80)
NEUTROPHIL %: 67 % (ref 43–77)
PLATELETS: 430 10*3/uL (ref 140–440)
RBC: 4.03 10*6/uL (ref 3.63–4.92)
RDW: 15.5 % (ref 12.3–17.7)
WBC: 10.2 10*3/uL (ref 3.8–11.8)

## 2022-10-07 LAB — COMPREHENSIVE METABOLIC PANEL, NON-FASTING
ALBUMIN/GLOBULIN RATIO: 1.7 — ABNORMAL HIGH (ref 0.8–1.4)
ALBUMIN: 4.3 g/dL (ref 3.5–5.7)
ALKALINE PHOSPHATASE: 48 U/L (ref 34–104)
ALT (SGPT): 17 U/L (ref 7–52)
ANION GAP: 8 mmol/L (ref 4–13)
AST (SGOT): 17 U/L (ref 13–39)
BILIRUBIN TOTAL: 0.4 mg/dL (ref 0.3–1.2)
BUN/CREA RATIO: 14 (ref 6–22)
BUN: 14 mg/dL (ref 7–25)
CALCIUM, CORRECTED: 9.1 mg/dL (ref 8.9–10.8)
CALCIUM: 9.3 mg/dL (ref 8.6–10.3)
CHLORIDE: 106 mmol/L (ref 98–107)
CO2 TOTAL: 24 mmol/L (ref 21–31)
CREATININE: 1.02 mg/dL (ref 0.60–1.30)
ESTIMATED GFR: 59 mL/min/{1.73_m2} — ABNORMAL LOW (ref >59)
GLOBULIN: 2.5 — ABNORMAL LOW (ref 2.9–5.4)
GLUCOSE: 96 mg/dL (ref 74–109)
OSMOLALITY, CALCULATED: 276 mOsm/kg (ref 270–290)
POTASSIUM: 4.5 mmol/L (ref 3.5–5.1)
PROTEIN TOTAL: 6.8 g/dL (ref 6.4–8.9)
SODIUM: 138 mmol/L (ref 136–145)

## 2022-10-07 LAB — FERRITIN: FERRITIN: 5 ng/mL — ABNORMAL LOW (ref 11–336)

## 2022-10-07 LAB — FOLATE: FOLATE: 12.7 ng/mL (ref 5.9–24.4)

## 2022-10-07 LAB — VITAMIN B12: VITAMIN B 12: >1500 pg/mL — ABNORMAL HIGH (ref 180–914)

## 2022-10-07 MED ORDER — PROCHLORPERAZINE MALEATE 5 MG TABLET
5.0000 mg | ORAL_TABLET | Freq: Four times a day (QID) | ORAL | 0 refills | Status: DC | PRN
Start: 2022-10-07 — End: 2022-11-09

## 2022-10-07 MED ORDER — LORATADINE 10 MG TABLET
10.0000 mg | ORAL_TABLET | Freq: Every day | ORAL | 0 refills | Status: DC
Start: 2022-10-07 — End: 2022-11-09

## 2022-10-07 MED ORDER — FAMOTIDINE 20 MG TABLET
20.0000 mg | ORAL_TABLET | Freq: Two times a day (BID) | ORAL | 0 refills | Status: DC
Start: 2022-10-07 — End: 2022-11-09

## 2022-10-07 NOTE — Cancer Center Note (Signed)
Katherine [name]  [id number]  1951-06-20   10/07/2022       Department of Hematology/Oncology  Return Patient Visit           REFERRING PROVIDER:  Merry Lofty, FNP-BC  [address]  [place],  [place] [address]      REASON FOR OFFICE VISIT:  Ongoing management and evaluation of  Right DCIS and iron deficiency anemia.      HISTORY OF PRESENT ILLNESS:  Katherine [name] is a 71 y.o. female who presents to today alone for evaluation of DCIS and Iron deficiency.   She reports PICA for Ice and Radishes, weak legs, and tingling in her hands.    She is scheduled for an MRI tomorrow.  She denies any blood in her stool.   The patient has had a good appetite and stable weight.  The patient has not noted any new areas of disease on own self exam.  The patient has not had any chest pain or dyspnea.  The patient has not had any headaches or changes in vision.  The patient has not had any abdominal pain, nausea, or vomiting.  There have been no changes in bowel or bladder habits.  The patient has not had any abnormal bleeding or clotting episodes.  There have been no complaints of fever, chills, cough, sputum production, dysuria, or diarrhea.      Initial Diagnosis:  04/1998-  Right breast Paget's Disease with DCIS   02/08/2000 Right breast Paget's disease reoccurrence DCIS    Surgeries:    6/28/199 Right breast segmental resection with positive margins.   06/1998  Right breast wide marginal incision with questionable focus of residual DCIS  02/23/2000 right breast mastectomy    Treatment History:  Tamoxifen 10 mg PO BID for 5 years.   Radiation therapy 5040 cGy in 28 fractions over six weeks.     REVIEW OF SYSTEMS:  Review of Systems   Constitutional:  Positive for fatigue. Negative for chills and fever.   HENT:  Negative.     Eyes: Negative.    Respiratory:  Positive for cough and shortness of breath (recent brochitis.).    Cardiovascular: Negative.  Negative for chest pain.   Gastrointestinal: Negative.  Negative for blood in stool,  diarrhea, nausea and vomiting.   Endocrine: Negative.    Genitourinary: Negative.  Negative for difficulty urinating.    Musculoskeletal: Negative.  Negative for arthralgias and back pain.   Skin: Negative.    Neurological: Negative.  Negative for dizziness and headaches.   Hematological: Negative.  Negative for adenopathy. Does not bruise/bleed easily.   Psychiatric/Behavioral: Negative.          Past Medical History:   Diagnosis Date    Anemia     B12 deficiency     Esophageal reflux     Essential hypertension     Hx of breast cancer     Hx of transfusion     Hypercholesterolemia     Osteoporosis     Thyroid disease     Vitamin D deficiency            Past Surgical History:   Procedure Laterality Date    HX BREAST LUMPECTOMY      HX COLONOSCOPY      HX RADICAL MASTECTOMY      HX TUBAL LIGATION             Social History     Socioeconomic History    Marital status:  Married     Spouse name: Not on file    Number of children: Not on file    Years of education: Not on file    Highest education level: Not on file   Occupational History    Not on file   Tobacco Use    Smoking status: Former     Types: Cigarettes    Smokeless tobacco: Never   Vaping Use    Vaping Use: Never used   Substance and Sexual Activity    Alcohol use: Never    Drug use: Never    Sexual activity: Not on file   Other Topics Concern    Not on file   Social History Narrative    Not on file     Social Determinants of Health     Financial Resource Strain: Low Risk  (09/27/2022)    Financial Resource Strain     SDOH Financial: No   Transportation Needs: Low Risk  (09/27/2022)    Transportation Needs     SDOH Transportation: No   Social Connections: Low Risk  (09/27/2022)    Social Connections     SDOH Social Isolation: 5 or more times a week   Intimate Partner Violence: High Risk (09/27/2022)    Intimate Partner Violence     SDOH Domestic Violence: No   Housing Stability: [place]  (09/27/2022)    Housing Stability     SDOH Housing Situation: I have  housing.     SDOH Housing Worry: No       Social History     Social History Narrative    Not on file       Social History     Substance and Sexual Activity   Drug Use Never       Family Medical History:       Problem Relation (Age of Onset)    Blood Clots Daughter    Hypertension (High Blood Pressure) Mother    Lung Cancer Father    Thyroid Disease Mother              Current Outpatient Medications   Medication Sig    albuterol sulfate (PROVENTIL OR VENTOLIN OR PROAIR) 90 mcg/actuation Inhalation oral inhaler Take 1-2 Puffs by inhalation Every 6 hours as needed    cyanocobalamin (VITAMIN B12) 2,500 mcg Sublingual Tablet, Sublingual Place 1 Tablet (2,500 mcg total) under the tongue Once a day    ergocalciferol, vitamin D2, (DRISDOL) 1,250 mcg (50,000 unit) Oral Capsule Take 1 Capsule (50,000 Units total) by mouth Every 7 days    Ibuprofen (MOTRIN) 800 mg Oral Tablet TAKE 1 TABLET BY MOUTH ONCE DAILY AS NEEDED WITH FOOD    levothyroxine (SYNTHROID) 75 mcg Oral Tablet Take 1 Tablet (75 mcg total) by mouth Every morning    lisinopriL (PRINIVIL) 10 mg Oral Tablet 1 Tablet (10 mg total) Once a day Takes 1.5 tables  so she has been doing 15 mg.    montelukast (SINGULAIR) 10 mg Oral Tablet Take 1 Tablet (10 mg total) by mouth Once a day    moxifloxacin (VIGAMOX) 0.5 % Ophthalmic Drops INSTILL 1 DROP INTO LEFT EYE 4 TIMES DAILY, START 1 DAY PRIOR TO SURGERY    omeprazole (PRILOSEC) 20 mg Oral Capsule, Delayed Release(E.C.) Take 1 Capsule (20 mg total) by mouth Every morning for 90 days    predniSONE (DELTASONE) 20 mg Oral Tablet Take 1 Tablet (20 mg total) by mouth Once a day    promethazine (PHENERGAN)  6.25 mg/5 mL Oral Syrup Take 5 mL (6.25 mg total) by mouth Every 6 hours as needed for Nausea/Vomiting       Allergies   Allergen Reactions    Boniva [Ibandronate]     Lipitor [Atorvastatin]          PHYSICAL EXAM:  BP (!) 142/62 (Site: Left, Patient Position: Sitting, Cuff Size: Adult)   Pulse 87   Temp (!) 35.8 C (96.5  F) (Temporal)   Ht 1.6 m ('5\' 3"'$ )   Wt 65.7 kg (144 lb 12.8 oz)   SpO2 98%   BMI 25.65 kg/m        ECOG Status: (0) Fully active, able to carry on all predisease performance without restriction   Physical Exam  Vitals reviewed.   Constitutional:       Appearance: Normal appearance. She is normal weight.   HENT:      Head: Normocephalic.   Eyes:      Conjunctiva/sclera: Conjunctivae normal.      Pupils: Pupils are equal, round, and reactive to light.   Neck:      Thyroid: No thyroid mass, thyromegaly or thyroid tenderness.   Cardiovascular:      Rate and Rhythm: Normal rate and regular rhythm.      Pulses: Normal pulses.      Heart sounds: Normal heart sounds, S1 normal and S2 normal. No murmur heard.     No S3 or S4 sounds.   Pulmonary:      Effort: Pulmonary effort is normal.      Breath sounds: Normal breath sounds.   Chest:      Comments: Patient declines breast exam    Abdominal:      General: Bowel sounds are normal.      Palpations: Abdomen is soft.   Musculoskeletal:         General: Normal range of motion.      Cervical back: Normal range of motion and neck supple.      Right lower leg: No edema.      Left lower leg: No edema.   Lymphadenopathy:      Head:      Right side of head: No submental, submandibular, posterior auricular or occipital adenopathy.      Left side of head: No submental, submandibular, posterior auricular or occipital adenopathy.      Cervical: No cervical adenopathy.      Right cervical: No superficial, deep or posterior cervical adenopathy.     Left cervical: No superficial, deep or posterior cervical adenopathy.      Upper Body:      Right upper body: No supraclavicular or axillary adenopathy.      Left upper body: No supraclavicular or axillary adenopathy.      Lower Body: No right inguinal adenopathy. No left inguinal adenopathy.   Skin:     General: Skin is warm and dry.      Capillary Refill: Capillary refill takes less than 2 seconds.   Neurological:      General: No focal  deficit present.      Mental Status: She is alert. Mental status is at baseline.      Sensory: Sensation is intact.      Motor: Motor function is intact.      Coordination: Coordination is intact.      Gait: Gait is intact.   Psychiatric:         Attention and Perception: Attention and perception normal.  Mood and Affect: Mood and affect normal.         Speech: Speech normal.         Behavior: Behavior normal. Behavior is cooperative.         Thought Content: Thought content normal.         Cognition and Memory: Cognition and memory normal.         Judgment: Judgment normal.         Radiology:   Mammogram 2023 at [facility]    LABS:       Media Information            ASSESSMENT:    ICD-10-CM    1. 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 Iron deficiency anemia secondary to inadequate dietary iron intake  D50.8 COMPREHENSIVE METABOLIC PANEL, NON-FASTING     IRON TRANSFERRIN AND TIBC     FERRITIN     CBC/DIFF     RETICULOCYTE COUNT     VITAMIN B12     FOLATE      3. Ductal carcinoma in situ (DCIS) of right breast  D05.11 COMPREHENSIVE METABOLIC PANEL, NON-FASTING     CBC/DIFF      4. Anemia, unspecified type  D64.9 COMPREHENSIVE METABOLIC PANEL, NON-FASTING     IRON TRANSFERRIN AND TIBC     FERRITIN     CBC/DIFF     RETICULOCYTE COUNT     VITAMIN B12     FOLATE           PLAN:   1. Iron-deficiency anemia:  Symptomatic at this time.  I will check her labs today and then order iron as appropriate.   2.  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  She continues to have yearly mammograms as scheduled.  3. Former smoker of approximately 20 pack years.  I did discuss with the patient low-dose CT scanning.  However patient quit smoking in the 1980's and does not meet the guidelines due to this.       Return in about 6 weeks (around 11/18/2022).     Katherine [name] was given  the chance to ask questions, and these were answered to their satisfaction. The patient is welcome to call with any questions or concerns in the meantime.     On the day of the encounter, I spent a total of 23 minutes on this patient encounter including review of historical information, examination, documentation and post-visit activities.     Patrcia Dolly APRN, FNP-BC, AOCNP, 10/07/2022 , 12:00     You can see your note(s) in [facility]. It is common for you to encounter certain medical terminology which may be unfamiliar to you. You might see results before your provider does so please give at least 2 business days for review. Please have this understanding, that NOT all abnormal results are significant. Our office will contact you for any urgent or emergent action if necessary. If you have any questions or concerns, feel free to send a MyChart message or call the office. Please call with any new or concerning symptoms.       This note was partially generated using MModal Fluency Direct system, and there may be some incorrect words, spellings, and punctuation that were not noted in checking the note before saving.     CC:  Rise Paganini  Whitt, FNP-BC  [address]  [place] [id number]

## 2022-10-07 NOTE — Addendum Note (Signed)
Addended by: Patrcia Dolly on: 10/07/2022 12:41 PM     Modules accepted: Orders

## 2022-10-10 ENCOUNTER — Other Ambulatory Visit (HOSPITAL_COMMUNITY): Payer: Self-pay

## 2022-10-10 IMAGING — MR MRI LUMBAR SPINE WITHOUT CONTRAST
5 of 6 series · 32 of 48 positions shown · non-contrast
Comparison: None available.

﻿EXAM:  [id number]   MRI LUMBAR SPINE WITHOUT CONTRAST
INDICATION: Low back pain radiating down left leg.
TECHNIQUE: Noncontrast multiplanar, multisequence MRI was performed.

[Series 8: T2 · sagittal · 4.0mm · 0.94mm/px · 6 of 13 slices shown (1 of 3)]
[im 1/13]
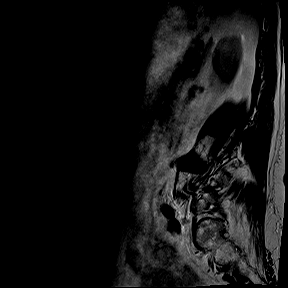
[im 3/13]
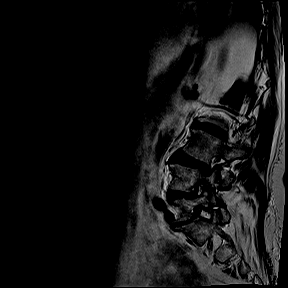
[im 5/13]
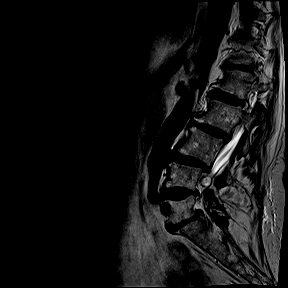
[im 8/13]
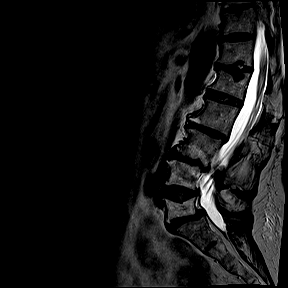
[im 10/13]
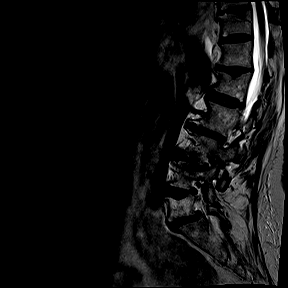
[im 13/13]
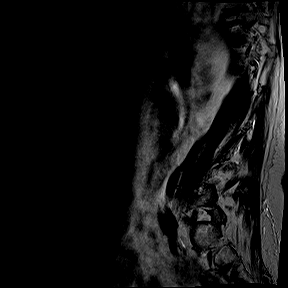

[Series 9: T1 · sagittal · 4.0mm · 0.94mm/px · 6 of 13 slices shown (1 of 2)]
[im 1/13]
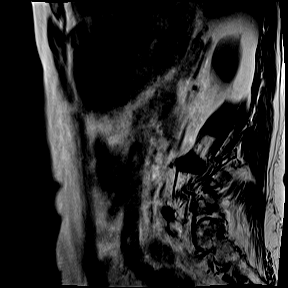
[im 3/13]
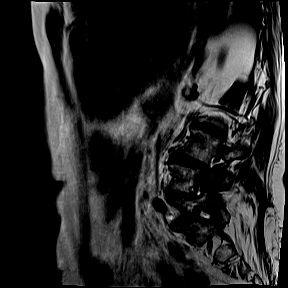
[im 5/13]
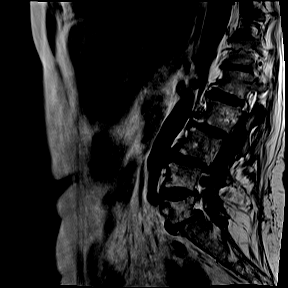
[im 8/13]
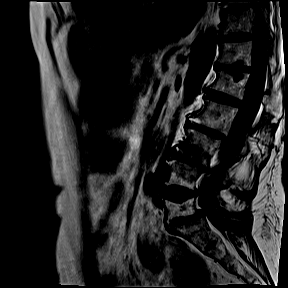
[im 10/13]
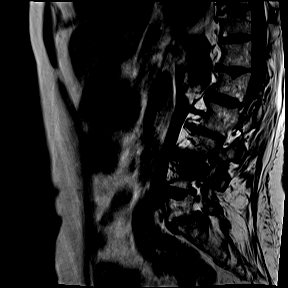
[im 13/13]
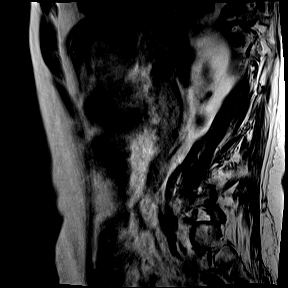

[Series 11: T2 · axial · 4.0mm · 0.52mm/px · z∈[-122,+60]mm · 11 of 23 slices shown (2 of 3)]
[im 1/23]
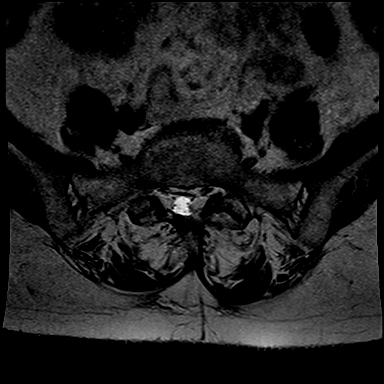
[im 3/23]
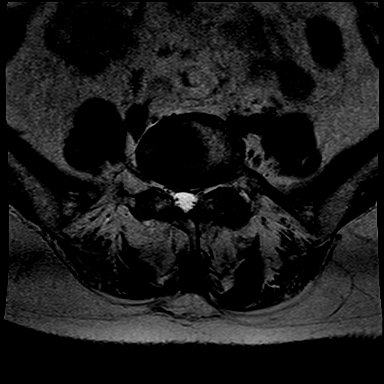
[im 5/23]
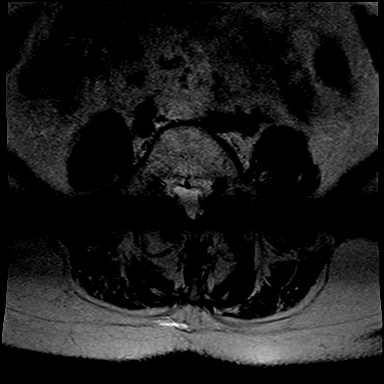
[im 7/23]
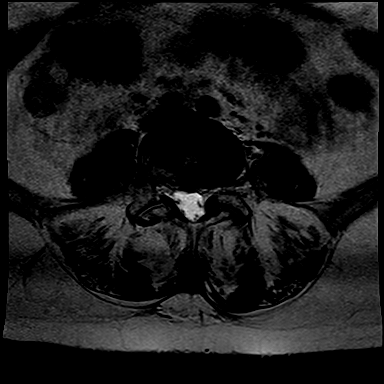
[im 9/23]
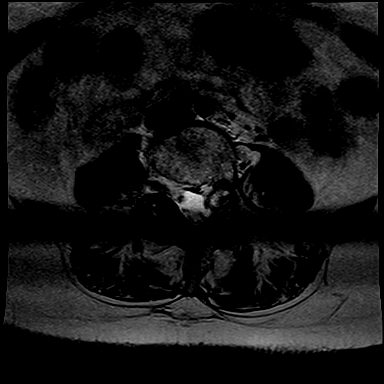
[im 12/23]
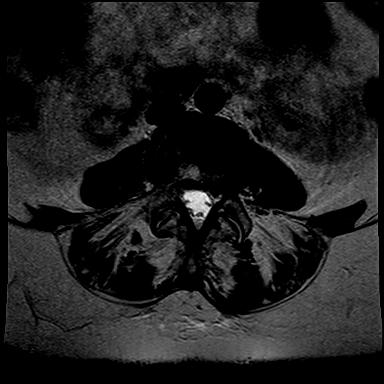
[im 14/23]
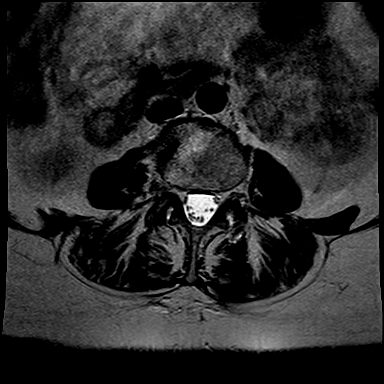
[im 16/23]
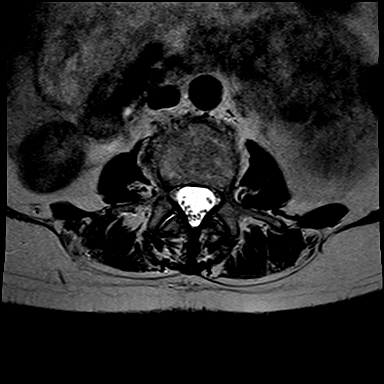
[im 18/23]
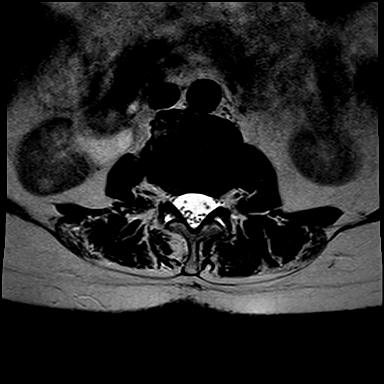
[im 20/23]
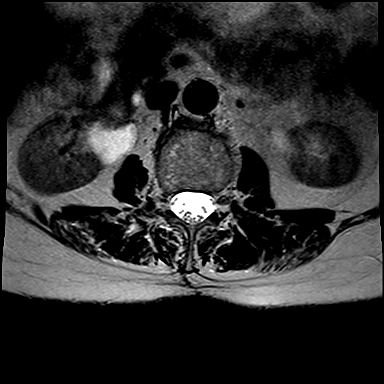
[im 23/23]
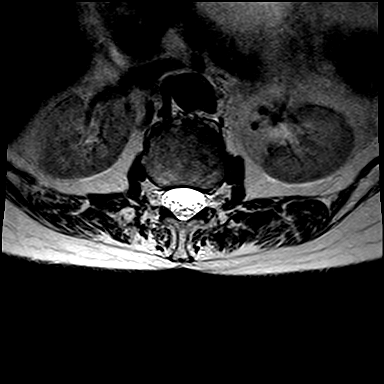

[Series 12: T1 · axial · 4.0mm · 0.52mm/px · 1 of 23 slices shown (2 of 2)]
[im 1/23]
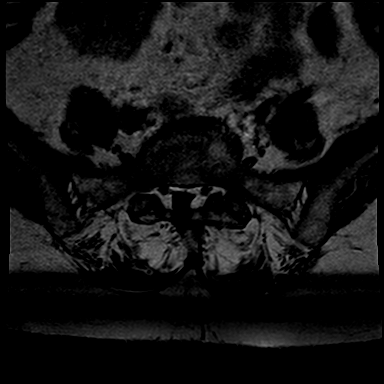

[Series 13: T2 · coronal · 5.0mm · 0.82mm/px · 8 of 18 slices shown (3 of 3)]
[im 1/18]
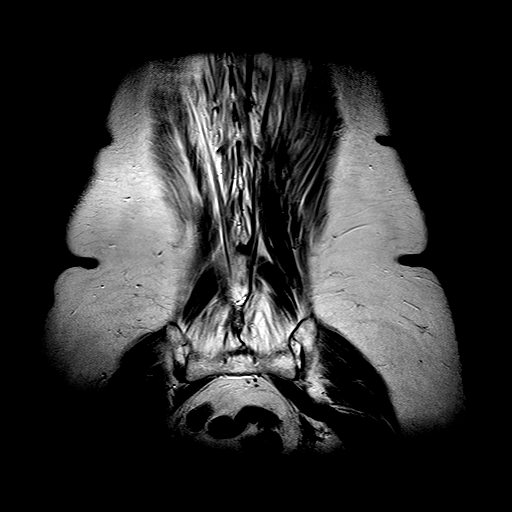
[im 3/18]
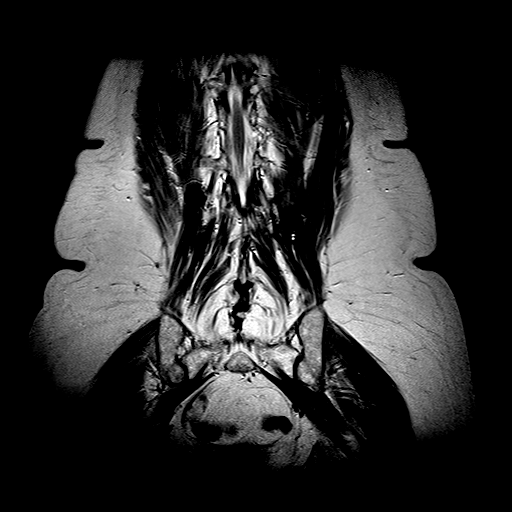
[im 5/18]
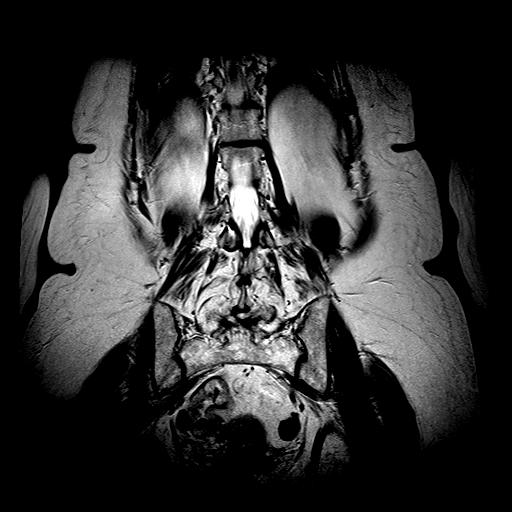
[im 8/18]
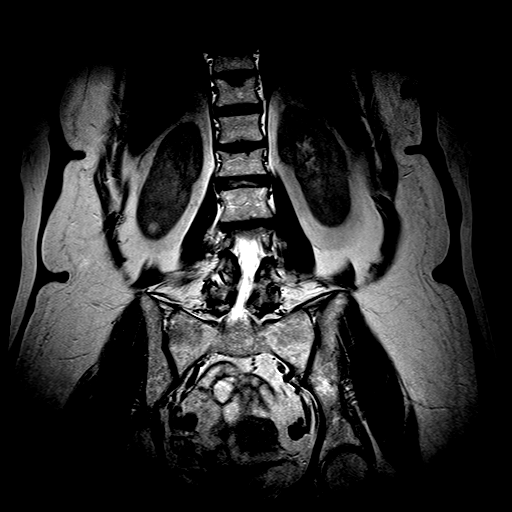
[im 10/18]
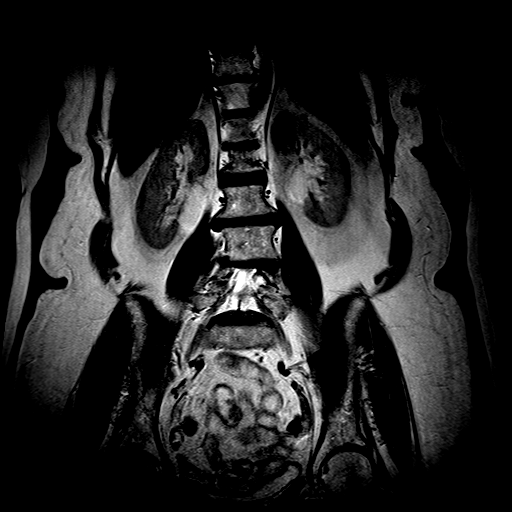
[im 13/18]
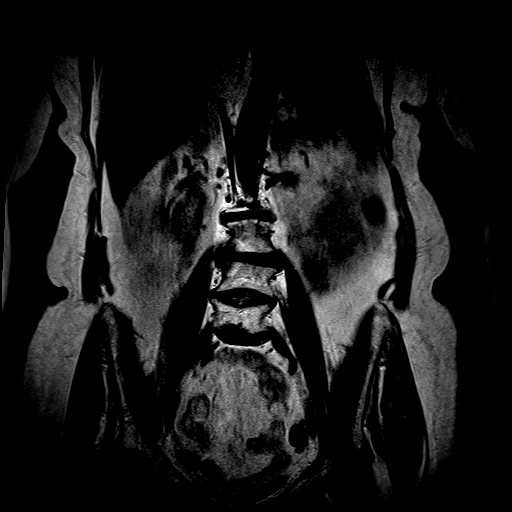
[im 15/18]
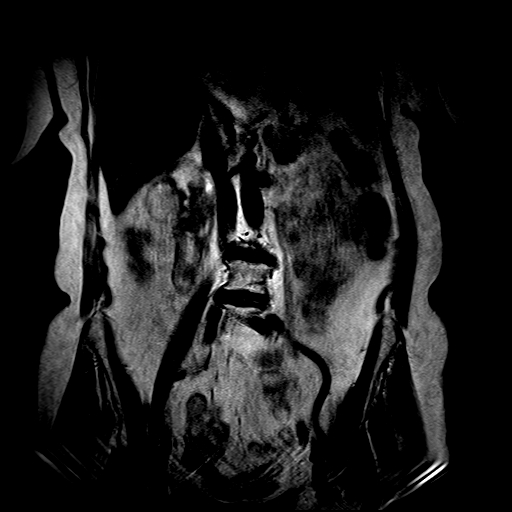
[im 18/18]
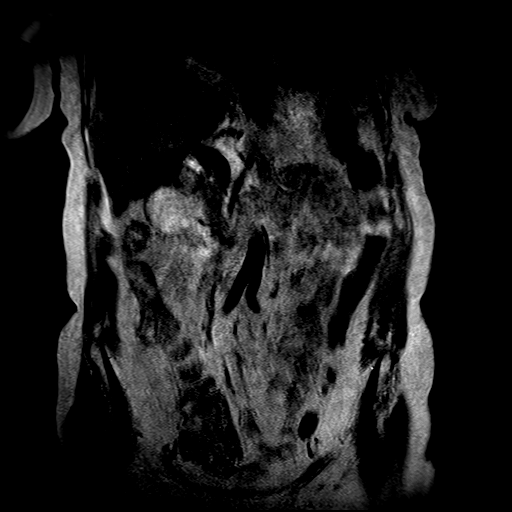

[32 of 48 positions shown; findings below may reference images not displayed]

FINDINGS: There are moderate degenerative changes at multiple levels with disc space narrowing, disc desiccation, degenerative marrow signal alteration, and osteophyte formation.  Mild subluxation is compatible with degenerative change.  

There is a mild L5 compression deformity that appears to be old.  There is no associated marrow edema. There are multiple small Schmorl's nodes.

The conus appears unremarkable.  

At L4, there is a small, round structure on the left that probably represents a synovial cyst.  There is compression of the left side of the thecal sac and possible compression of the left L3 or L4 nerve roots.  

There is mild disc bulging at L5-S1.  Mild L5-S1 subluxation is compatible with degenerative change.  There is otherwise no significant disc protrusion.  There is no significant spinal stenosis.
IMPRESSION: Probable synovial cyst at L4 on the left with possible nerve root compression.

## 2022-10-10 NOTE — Telephone Encounter (Signed)
Pt was seen by hema/onc 10/07/2022  [facility]

## 2022-10-17 ENCOUNTER — Other Ambulatory Visit (INDEPENDENT_AMBULATORY_CARE_PROVIDER_SITE_OTHER): Payer: Self-pay | Admitting: Family

## 2022-10-17 ENCOUNTER — Encounter (INDEPENDENT_AMBULATORY_CARE_PROVIDER_SITE_OTHER): Payer: Self-pay | Admitting: Family

## 2022-10-17 DIAGNOSIS — S32000A Wedge compression fracture of unspecified lumbar vertebra, initial encounter for closed fracture: Secondary | ICD-10-CM

## 2022-10-17 DIAGNOSIS — M549 Dorsalgia, unspecified: Secondary | ICD-10-CM

## 2022-10-17 DIAGNOSIS — M7138 Other bursal cyst, other site: Secondary | ICD-10-CM

## 2022-11-09 ENCOUNTER — Other Ambulatory Visit: Payer: Self-pay

## 2022-11-09 ENCOUNTER — Ambulatory Visit (INDEPENDENT_AMBULATORY_CARE_PROVIDER_SITE_OTHER): Payer: Medicare Other | Admitting: PHYSICIAN ASSISTANT

## 2022-11-09 ENCOUNTER — Encounter (INDEPENDENT_AMBULATORY_CARE_PROVIDER_SITE_OTHER): Payer: Self-pay | Admitting: PHYSICIAN ASSISTANT

## 2022-11-09 VITALS — BP 109/59 | HR 105 | Ht 63.0 in | Wt 149.0 lb

## 2022-11-09 DIAGNOSIS — M4316 Spondylolisthesis, lumbar region: Secondary | ICD-10-CM

## 2022-11-09 DIAGNOSIS — M7138 Other bursal cyst, other site: Secondary | ICD-10-CM

## 2022-11-09 NOTE — H&P (Signed)
NEUROSURGERY, [facility]  [place] [address]    History & Physical    Name: Katherine [name] MRN:  [id number]   Date: 11/09/2022 Age: 71 y.o.       Subjective:     Patient ID:   Katherine [name] is an 71 y.o. female.      Chief Complaint:      Chief Complaint   Patient presents with    Establish Care     Urgent compression fracture.         HPI    The patient is a pleasant 71 year old female who is referred for evaluation of a compression fracture.  The patient reports a several month history of pain that radiates into the left hip and anterior portion of the left thigh and into the anterior lower left extremity.  She denies any traumatic injury that preceded this pain.  She underwent evaluation by her PCP and x-rays were ordered.  This showed an area of compression fracture involving the lumbar spine.  Therefore, MRI was undertaken to assess acuity.  She was referred to Neurosurgery at that time as well.  The patient reports continued pain that radiates into the left lower extremity consistent with an L4 radiculopathy.  She reports his pain is 6/10 at rest and increases with activity.  She has aching type pain in the left leg that is accompanied by numbness and tingling.  She has been taking anti-inflammatories, including ibuprofen 800 mg twice daily, and states that this does help with her symptoms some.  She has not had physical therapy.  She has not undergone any spinal injections.  She does have a history of osteoporosis.    Past Medical History:   Diagnosis Date    Anemia     B12 deficiency     Esophageal reflux     Essential hypertension     Hx of breast cancer     Hx of transfusion     Hypercholesterolemia     Osteoporosis     Thyroid disease     Vitamin D deficiency       Past Surgical History:   Procedure Laterality Date    HX BREAST LUMPECTOMY      HX COLONOSCOPY      HX RADICAL MASTECTOMY      HX TUBAL LIGATION        Social History     Tobacco Use     Smoking status: Former     Types: Cigarettes    Smokeless tobacco: Never   Vaping Use    Vaping Use: Never used   Substance Use Topics    Alcohol use: Never    Drug use: Never      Family Medical History:       Problem Relation (Age of Onset)    Blood Clots Daughter    Hypertension (High Blood Pressure) Mother    Lung Cancer Father    Thyroid Disease Mother           Current Outpatient Medications   Medication Sig    albuterol sulfate (PROVENTIL OR VENTOLIN OR PROAIR) 90 mcg/actuation Inhalation oral inhaler Take 1-2 Puffs by inhalation Every 6 hours as needed    cyanocobalamin (VITAMIN B12) 2,500 mcg Sublingual Tablet, Sublingual Place 1 Tablet (2,500 mcg total) under the tongue Once a day    ergocalciferol, vitamin D2, (DRISDOL) 1,250 mcg (50,000 unit) Oral Capsule Take 1 Capsule (50,000 Units total) by mouth  Every 7 days    Ibuprofen (MOTRIN) 800 mg Oral Tablet TAKE 1 TABLET BY MOUTH ONCE DAILY AS NEEDED WITH FOOD    levothyroxine (SYNTHROID) 75 mcg Oral Tablet Take 1 Tablet (75 mcg total) by mouth Every morning    lisinopriL (PRINIVIL) 10 mg Oral Tablet 1 Tablet (10 mg total) Once a day Takes 1.5 tables  so she has been doing 15 mg.    methocarbamoL (ROBAXIN) 500 mg Oral Tablet Take 1 Tablet (500 mg total) by mouth Four times a day    omeprazole (PRILOSEC) 20 mg Oral Capsule, Delayed Release(E.C.) Take 1 Capsule (20 mg total) by mouth Every morning for 90 days       Allergies   Allergen Reactions    Boniva [Ibandronate]     Lipitor [Atorvastatin]         Review of Systems:  Review of systems pertinent negatives and postives as discussed in HPI.     Objective:   Neurological Exam  Mental Status  Awake, alert and oriented to person, place and time. Oriented to person, place and time. Recent and remote memory are intact. Speech is normal. Language is fluent with no aphasia. Attention and concentration are normal.    Cranial Nerves  CN II: Visual acuity is normal. Visual fields full to confrontation.  CN III, IV,  VI: Extraocular movements intact bilaterally. Normal lids and orbits bilaterally. Pupils equal round and reactive to light bilaterally.  CN V: Facial sensation is normal.  CN VII: Full and symmetric facial movement.  CN VIII: Hearing is normal.  CN IX, X: Palate elevates symmetrically. Normal gag reflex.  CN XI: Shoulder shrug strength is normal.  CN XII: Tongue midline without atrophy or fasciculations.    Motor  Normal muscle bulk throughout. No fasciculations present. Normal muscle tone. Strength is 5/5 throughout all four extremities.    Sensory  Sensation is intact to light touch, pinprick, vibration and proprioception in all four extremities.    Reflexes  Deep tendon reflexes are 2+ and symmetric in all four extremities.    Right pathological reflexes: Hoffmann's absent. Ankle clonus absent.  Left pathological reflexes: Hoffmann's absent. Ankle clonus absent.    Coordination    Finger-to-nose, rapid alternating movements and heel-to-shin normal bilaterally without dysmetria.    Gait  Normal casual, toe, heel and tandem gait.    Straight leg raise on the left is positive..      Data reviewed:  Imaging:  I have reviewed MRI of the lumbar spine performed at [facility] on 10/10/2022.  This imaging shows multilevel degenerative disc disease and ligamentum flavum hypertrophy.  This shows mild grade 1 retrolisthesis of L3 on L4 with severe right and mild-to-moderate left neuroforaminal stenosis.  At L4-5, there is a large left synovial cyst that causes severe neuroforaminal stenosis.  There is mild-to-moderate spinal canal stenosis and moderate neuroforaminal stenosis on the right present.  At L5-S1, there is a grade 1 anterolisthesis of L5 on S1 with severe left and moderate right neuroforaminal stenosis.  Moderate spinal canal stenosis is present at this level.    Assessment & Plan:       ICD-10-CM    1. 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  No intervention regarding this issue.  However, the patient does have stenosis related to her synovial cyst on the left at L4-5.  She does seem to have symptoms including a left L4 radiculopathy related to this area as well.  Therefore, we will initiate conservative treatment of this pain.  We will refer the patient to physical therapy to see if we can gain some improvement of her symptoms.  We will plan for a left L4 transforaminal epidural steroid injection to see if we can gain some improvement of the patient's radicular symptoms.  We will also obtain flexion-extension x-rays of the patient's lumbar spine to evaluate for areas of instability at the L4-5 level should surgery be necessary.  She should continue anti-inflammatories as needed and as tolerated in the interim.  We will follow up with the patient after her physical therapy and injections have been completed.    On the day of the encounter, a total of  30  minutes was spent on this patient encounter including review of historical information, examination, documentation and post-visit activities. The time documented excludes procedural time.   Silas Flood, PA-C    This note was partially generated using MModal Fluency Direct system, and there may be some incorrect words, spellings, and punctuation that were not noted in checking the note before saving.

## 2022-11-11 ENCOUNTER — Telehealth (INDEPENDENT_AMBULATORY_CARE_PROVIDER_SITE_OTHER): Payer: Self-pay | Admitting: Family

## 2022-11-11 NOTE — Telephone Encounter (Signed)
She has not had an transfusion yet.  Is having weakness and SOB. She called her insurance company and they stated they have not heard anything about this.  She started yesterday taking slow FE.   The oncology office was supposed to set this up. Oncology is telling her they are waiting on approval from her insurance company. She is wanting to know if she should continue on the iron supplement. She thought that this office could help this get started faster.

## 2022-11-11 NOTE — Telephone Encounter (Unsigned)
The iron infusions are not helping pt. At all. She needs to know what to do?

## 2022-11-15 ENCOUNTER — Other Ambulatory Visit (HOSPITAL_COMMUNITY): Payer: Self-pay | Admitting: NURSE PRACTITIONER

## 2022-11-15 ENCOUNTER — Encounter (HOSPITAL_COMMUNITY): Payer: Self-pay | Admitting: NURSE PRACTITIONER

## 2022-11-15 DIAGNOSIS — D508 Other iron deficiency anemias: Secondary | ICD-10-CM

## 2022-11-15 NOTE — Telephone Encounter (Signed)
Patient notified states she will get in touch with oncology office about getting venofer infusions scheduled and contact her insurance company about approval for   iron infusions ks,lpn

## 2022-11-16 ENCOUNTER — Other Ambulatory Visit: Payer: Medicare Other | Attending: NURSE PRACTITIONER

## 2022-11-16 ENCOUNTER — Other Ambulatory Visit: Payer: Self-pay

## 2022-11-16 ENCOUNTER — Other Ambulatory Visit (HOSPITAL_COMMUNITY): Payer: Self-pay | Admitting: NURSE PRACTITIONER

## 2022-11-16 DIAGNOSIS — D508 Other iron deficiency anemias: Secondary | ICD-10-CM

## 2022-11-16 LAB — COMPREHENSIVE METABOLIC PANEL, NON-FASTING
ALBUMIN/GLOBULIN RATIO: 1.7 — ABNORMAL HIGH (ref 0.8–1.4)
ALBUMIN: 4 g/dL (ref 3.5–5.7)
ALKALINE PHOSPHATASE: 45 U/L (ref 34–104)
ALT (SGPT): 10 U/L (ref 7–52)
ANION GAP: 11 mmol/L (ref 4–13)
AST (SGOT): 15 U/L (ref 13–39)
BILIRUBIN TOTAL: 0.3 mg/dL (ref 0.3–1.2)
BUN/CREA RATIO: 18 (ref 6–22)
BUN: 17 mg/dL (ref 7–25)
CALCIUM, CORRECTED: 9 mg/dL (ref 8.9–10.8)
CALCIUM: 9 mg/dL (ref 8.6–10.3)
CHLORIDE: 109 mmol/L — ABNORMAL HIGH (ref 98–107)
CO2 TOTAL: 19 mmol/L — ABNORMAL LOW (ref 21–31)
CREATININE: 0.96 mg/dL (ref 0.60–1.30)
ESTIMATED GFR: 63 mL/min/{1.73_m2} (ref >59)
GLOBULIN: 2.4 — ABNORMAL LOW (ref 2.9–5.4)
GLUCOSE: 94 mg/dL (ref 74–109)
OSMOLALITY, CALCULATED: 279 mOsm/kg (ref 270–290)
POTASSIUM: 4 mmol/L (ref 3.5–5.1)
PROTEIN TOTAL: 6.4 g/dL (ref 6.4–8.9)
SODIUM: 139 mmol/L (ref 136–145)

## 2022-11-16 LAB — CBC WITH DIFF
BASOPHIL #: 0.1 10*3/uL (ref 0.00–0.10)
BASOPHIL %: 2 % — ABNORMAL HIGH (ref 0–1)
EOSINOPHIL #: 0.3 10*3/uL (ref 0.00–0.50)
EOSINOPHIL %: 5 %
HCT: 18.3 % — CL (ref 31.2–41.9)
HGB: 5.4 g/dL — CL (ref 10.9–14.3)
LYMPHOCYTE #: 1.3 10*3/uL (ref 1.00–3.00)
LYMPHOCYTE %: 23 % (ref 16–44)
MCH: 20.3 pg — ABNORMAL LOW (ref 24.7–32.8)
MCHC: 29.4 g/dL — ABNORMAL LOW (ref 32.3–35.6)
MCV: 69.2 fL — ABNORMAL LOW (ref 75.5–95.3)
MONOCYTE #: 0.6 10*3/uL (ref 0.30–1.00)
MONOCYTE %: 11 % (ref 5–13)
MPV: 7.7 fL — ABNORMAL LOW (ref 7.9–10.8)
NEUTROPHIL #: 3.3 10*3/uL (ref 1.85–7.80)
NEUTROPHIL %: 59 % (ref 43–77)
PLATELET COMMENT: NORMAL
PLATELETS: 345 10*3/uL (ref 140–440)
RBC: 2.65 10*6/uL — ABNORMAL LOW (ref 3.63–4.92)
RDW: 18.5 % — ABNORMAL HIGH (ref 12.3–17.7)
WBC: 5.6 10*3/uL (ref 3.8–11.8)

## 2022-11-16 LAB — IRON TRANSFERRIN AND TIBC
IRON: <10 ug/dL — ABNORMAL LOW (ref 50–212)
TOTAL IRON BINDING CAPACITY: 557 ug/dL — ABNORMAL HIGH (ref 250–450)
TRANSFERRIN: 398 mg/dL — ABNORMAL HIGH (ref 203–362)

## 2022-11-16 LAB — FERRITIN: FERRITIN: 10 ng/mL — ABNORMAL LOW (ref 11–336)

## 2022-11-17 ENCOUNTER — Encounter (HOSPITAL_COMMUNITY): Payer: Self-pay

## 2022-11-17 ENCOUNTER — Ambulatory Visit
Admission: RE | Admit: 2022-11-17 | Discharge: 2022-11-17 | Disposition: A | Discharge status: Home / Self Care | Payer: Medicare Other | Source: Ambulatory Visit | Attending: NURSE PRACTITIONER | Admitting: NURSE PRACTITIONER

## 2022-11-17 VITALS — BP 133/61 | HR 89 | Temp 99.2°F | Resp 18

## 2022-11-17 DIAGNOSIS — D508 Other iron deficiency anemias: Secondary | ICD-10-CM | POA: Insufficient documentation

## 2022-11-17 MED ORDER — HYDROCORTISONE SOD SUCCINATE 100 MG/2 ML VIAL WRAPPER
100.0000 mg | Freq: Once | INTRAMUSCULAR | Status: DC | PRN
Start: 2022-11-17 — End: 2022-11-18

## 2022-11-17 MED ORDER — ACETAMINOPHEN 325 MG TABLET
ORAL_TABLET | ORAL | Status: AC
Start: 2022-11-17 — End: 2022-11-17
  Filled 2022-11-17: qty 2

## 2022-11-17 MED ORDER — ACETAMINOPHEN 325 MG TABLET
650.0000 mg | ORAL_TABLET | Freq: Once | ORAL | Status: DC | PRN
Start: 2022-11-17 — End: 2022-11-18

## 2022-11-17 MED ORDER — FUROSEMIDE 10 MG/ML INJECTION SOLUTION
20.0000 mg | Freq: Once | INTRAMUSCULAR | Status: AC
Start: 2022-11-17 — End: 2022-11-17
  Administered 2022-11-17: 20 mg via INTRAVENOUS

## 2022-11-17 MED ORDER — FUROSEMIDE 10 MG/ML INJECTION SOLUTION
INTRAMUSCULAR | Status: AC
Start: 2022-11-17 — End: 2022-11-17
  Filled 2022-11-17: qty 4

## 2022-11-17 MED ORDER — DIPHENHYDRAMINE 50 MG CAPSULE
50.0000 mg | ORAL_CAPSULE | Freq: Once | ORAL | Status: DC | PRN
Start: 2022-11-17 — End: 2022-11-18
  Administered 2022-11-17: 50 mg via ORAL

## 2022-11-17 MED ORDER — DIPHENHYDRAMINE 50 MG CAPSULE
50.0000 mg | ORAL_CAPSULE | Freq: Once | ORAL | Status: DC | PRN
Start: 2022-11-17 — End: 2022-11-18

## 2022-11-17 MED ORDER — DIPHENHYDRAMINE 50 MG CAPSULE
ORAL_CAPSULE | ORAL | Status: AC
Start: 2022-11-17 — End: 2022-11-17
  Filled 2022-11-17: qty 1

## 2022-11-17 MED ORDER — ACETAMINOPHEN 325 MG TABLET
650.0000 mg | ORAL_TABLET | Freq: Once | ORAL | Status: DC | PRN
Start: 2022-11-17 — End: 2022-11-18
  Administered 2022-11-17: 650 mg via ORAL

## 2022-11-17 MED ORDER — SODIUM CHLORIDE 0.9 % IV BOLUS
40.0000 mL | INJECTION | Freq: Once | Status: AC | PRN
Start: 2022-11-17 — End: 2022-11-17

## 2022-11-17 NOTE — Nurses Notes (Addendum)
1194 Patient ambulated to floor. She is here for two units PRBC transfusions. Patient received handout on what to expect with blood transfusion and possible adverse reactions. Blood consent obtained. VSS. Audery Amel, RN  0800 IV access obtained in left arm. Type and cross drawn via venipuncture. Allergies and home medications updated at bedside with patient. Explained to patient we will now wait until blood bank notifies unit of blood is ready. Patient resting in bed drinking coffee. Audery Amel, RN  0830 lungs clear. Abdomen soft, and non-tender. No edema noted. Patient alert and oriented x4. Audery Amel, RN  [phone] first PRBC transfusion started. Patient aware of possible reactions. Call bell within reach, and patient instructed to notify nurse when help is needed. Will continue to monitor. Audery Amel, RN  [phone] 15 min vitals obtained. VSS. Patient resting in bed and verbalizes no new concerns at this time. Will continue to monitor. Audery Amel, RN  1141 VSS. First PRBC transfusion complete. Patient tolerated well. Audery Amel, RN  1159 Lasix IVP given. Audery Amel, RN  1201 second PRBC infusion started. Audery Amel, RN  1222  15 min vitals obtained. VSS. Patient resting in bed and verbalizes no new concerns at this time. Will continue to monitor. Audery Amel, RN  1425 VSS. second PRBC transfusion completed. Patient tolerated well Audery Amel, RN  8185 IV access removed. Flushed with normal saline, flushed without difficulty. Blood return present. Pressure dressing applied. Audery Amel, RN  [phone] patient left floor, via wheelchair accompanied by volunteer. Audery Amel, RN

## 2022-11-18 LAB — BPAM PACKED CELL ORDER
UNIT DIVISION: 0
UNIT DIVISION: 0

## 2022-11-18 LAB — TYPE AND CROSS RED CELLS - UNITS
ABO/RH(D): A POS
ANTIBODY SCREEN: NEGATIVE
UNITS ORDERED: 2

## 2022-11-22 ENCOUNTER — Ambulatory Visit (HOSPITAL_COMMUNITY): Payer: Self-pay | Admitting: NURSE PRACTITIONER

## 2022-11-24 ENCOUNTER — Other Ambulatory Visit: Payer: Self-pay

## 2022-11-24 ENCOUNTER — Ambulatory Visit
Admission: RE | Admit: 2022-11-24 | Discharge: 2022-11-24 | Disposition: A | Discharge status: Home / Self Care | Payer: Medicare Other | Source: Ambulatory Visit | Attending: Internal Medicine | Admitting: Internal Medicine

## 2022-11-24 VITALS — BP 141/78 | HR 73 | Temp 97.3°F | Resp 20

## 2022-11-24 DIAGNOSIS — D508 Other iron deficiency anemias: Secondary | ICD-10-CM | POA: Insufficient documentation

## 2022-11-24 MED ORDER — SODIUM CHLORIDE 0.9 % IV BOLUS
500.0000 mL | INJECTION | Freq: Once | Status: DC | PRN
Start: 2022-11-24 — End: 2022-11-25

## 2022-11-24 MED ORDER — FAMOTIDINE (PF) 20 MG/2 ML INTRAVENOUS SOLUTION
20.0000 mg | Freq: Once | INTRAVENOUS | Status: DC | PRN
Start: 2022-11-24 — End: 2022-11-25

## 2022-11-24 MED ORDER — SODIUM CHLORIDE 0.9 % INTRAVENOUS SOLUTION
750.0000 mg | Freq: Once | INTRAVENOUS | Status: AC
Start: 2022-11-24 — End: 2022-11-24
  Administered 2022-11-24: 0 mg via INTRAVENOUS
  Administered 2022-11-24: 750 mg via INTRAVENOUS
  Filled 2022-11-24: qty 15

## 2022-11-24 MED ORDER — HYDROCORTISONE SOD SUCCINATE 100 MG/2 ML VIAL WRAPPER
100.0000 mg | Freq: Once | INTRAMUSCULAR | Status: DC | PRN
Start: 2022-11-24 — End: 2022-11-25

## 2022-11-24 MED ORDER — ACETAMINOPHEN 325 MG TABLET
975.0000 mg | ORAL_TABLET | Freq: Once | ORAL | Status: DC | PRN
Start: 2022-11-24 — End: 2022-11-25

## 2022-11-24 MED ORDER — LORATADINE 10 MG TABLET
10.0000 mg | ORAL_TABLET | Freq: Once | ORAL | Status: DC | PRN
Start: 2022-11-24 — End: 2022-11-25

## 2022-11-24 MED ORDER — PROCHLORPERAZINE MALEATE 10 MG TABLET
10.0000 mg | ORAL_TABLET | Freq: Once | ORAL | Status: DC | PRN
Start: 2022-11-24 — End: 2022-11-25

## 2022-11-24 MED ORDER — ALBUTEROL SULFATE HFA 90 MCG/ACTUATION AEROSOL INHALER - RN
2.0000 | Freq: Once | RESPIRATORY_TRACT | Status: DC | PRN
Start: 2022-11-24 — End: 2022-11-25

## 2022-11-24 MED ORDER — EPINEPHRINE HCL (PF) 1 MG/ML (1 ML) INJECTION SOLUTION
0.3000 mg | Freq: Once | INTRAMUSCULAR | Status: DC | PRN
Start: 2022-11-24 — End: 2022-11-25

## 2022-11-24 MED ORDER — ALBUTEROL SULFATE 2.5 MG/3 ML (0.083 %) SOLUTION FOR NEBULIZATION
2.5000 mg | INHALATION_SOLUTION | Freq: Once | RESPIRATORY_TRACT | Status: DC | PRN
Start: 2022-11-24 — End: 2022-11-25

## 2022-11-24 NOTE — Nurses Notes (Signed)
Summary: pt arrived an received iv injectafer with no reactions or distress noted during or after infusion.assessment WNL.treatment complete iv removed with pressure dressing applied.down via walking at DC no concerns voiced upon leaving floor.Francis Dowse, LPN

## 2022-12-01 ENCOUNTER — Other Ambulatory Visit (HOSPITAL_COMMUNITY): Payer: Self-pay | Admitting: NURSE PRACTITIONER

## 2022-12-01 ENCOUNTER — Encounter (HOSPITAL_COMMUNITY): Payer: Self-pay | Admitting: NURSE PRACTITIONER

## 2022-12-01 ENCOUNTER — Ambulatory Visit
Admission: RE | Admit: 2022-12-01 | Discharge: 2022-12-01 | Disposition: A | Discharge status: Home / Self Care | Payer: Medicare Other | Source: Ambulatory Visit | Attending: HEMATOLOGY-ONCOLOGY | Admitting: HEMATOLOGY-ONCOLOGY

## 2022-12-01 ENCOUNTER — Other Ambulatory Visit: Payer: Self-pay

## 2022-12-01 VITALS — BP 111/58 | HR 82 | Temp 97.8°F | Resp 20

## 2022-12-01 DIAGNOSIS — D508 Other iron deficiency anemias: Secondary | ICD-10-CM

## 2022-12-01 MED ORDER — LORATADINE 10 MG TABLET
10.0000 mg | ORAL_TABLET | Freq: Once | ORAL | Status: DC | PRN
Start: 2022-12-01 — End: 2022-12-02

## 2022-12-01 MED ORDER — ALBUTEROL SULFATE HFA 90 MCG/ACTUATION AEROSOL INHALER - RN
2.0000 | Freq: Once | RESPIRATORY_TRACT | Status: DC | PRN
Start: 2022-12-01 — End: 2022-12-02

## 2022-12-01 MED ORDER — ACETAMINOPHEN 325 MG TABLET
975.0000 mg | ORAL_TABLET | Freq: Once | ORAL | Status: DC | PRN
Start: 2022-12-01 — End: 2022-12-02

## 2022-12-01 MED ORDER — SODIUM CHLORIDE 0.9 % INTRAVENOUS SOLUTION
750.0000 mg | Freq: Once | INTRAVENOUS | Status: AC
Start: 2022-12-01 — End: 2022-12-01
  Administered 2022-12-01: 0 mg via INTRAVENOUS
  Administered 2022-12-01: 750 mg via INTRAVENOUS
  Filled 2022-12-01: qty 15

## 2022-12-01 MED ORDER — FAMOTIDINE (PF) 20 MG/2 ML INTRAVENOUS SOLUTION
20.0000 mg | Freq: Once | INTRAVENOUS | Status: DC | PRN
Start: 2022-12-01 — End: 2022-12-02

## 2022-12-01 MED ORDER — PROCHLORPERAZINE MALEATE 10 MG TABLET
10.0000 mg | ORAL_TABLET | Freq: Once | ORAL | Status: DC | PRN
Start: 2022-12-01 — End: 2022-12-02

## 2022-12-01 MED ORDER — ALBUTEROL SULFATE 2.5 MG/3 ML (0.083 %) SOLUTION FOR NEBULIZATION
2.5000 mg | INHALATION_SOLUTION | Freq: Once | RESPIRATORY_TRACT | Status: DC | PRN
Start: 2022-12-01 — End: 2022-12-02

## 2022-12-01 MED ORDER — SODIUM CHLORIDE 0.9 % IV BOLUS
500.0000 mL | INJECTION | Freq: Once | Status: DC | PRN
Start: 2022-12-01 — End: 2022-12-02

## 2022-12-01 MED ORDER — HYDROCORTISONE SOD SUCCINATE 100 MG/2 ML VIAL WRAPPER
100.0000 mg | Freq: Once | INTRAMUSCULAR | Status: DC | PRN
Start: 2022-12-01 — End: 2022-12-02

## 2022-12-01 MED ORDER — EPINEPHRINE HCL (PF) 1 MG/ML (1 ML) INJECTION SOLUTION
0.3000 mg | Freq: Once | INTRAMUSCULAR | Status: DC | PRN
Start: 2022-12-01 — End: 2022-12-02

## 2022-12-01 NOTE — Nurses Notes (Signed)
Summary: pt arrived an received iv injectafer with no reactions or distress noted during or after infusion.assessment WNL.treatment complete iv removed with pressure dressing applied.down via walking at DC no concerns voiced upon leaving floor.Francis Dowse, LPN

## 2022-12-02 ENCOUNTER — Encounter (HOSPITAL_COMMUNITY): Payer: Self-pay | Admitting: NURSE PRACTITIONER

## 2022-12-05 ENCOUNTER — Telehealth (INDEPENDENT_AMBULATORY_CARE_PROVIDER_SITE_OTHER): Payer: Self-pay | Admitting: Family

## 2022-12-05 IMAGING — DX XRAY LUMBAR SPINE MINIMUM 4 VIEWS
1 series · 4 of 4 positions shown · non-contrast
Comparison: Exam dated 09/27/2022.

﻿EXAM:  22111      XRAY LUMBAR SPINE MINIMUM 4 VIEWS
INDICATION: Low back pain.

[Series 1: AP · 0.14mm/px · 4 of 4 slices shown]
[im 1/4]
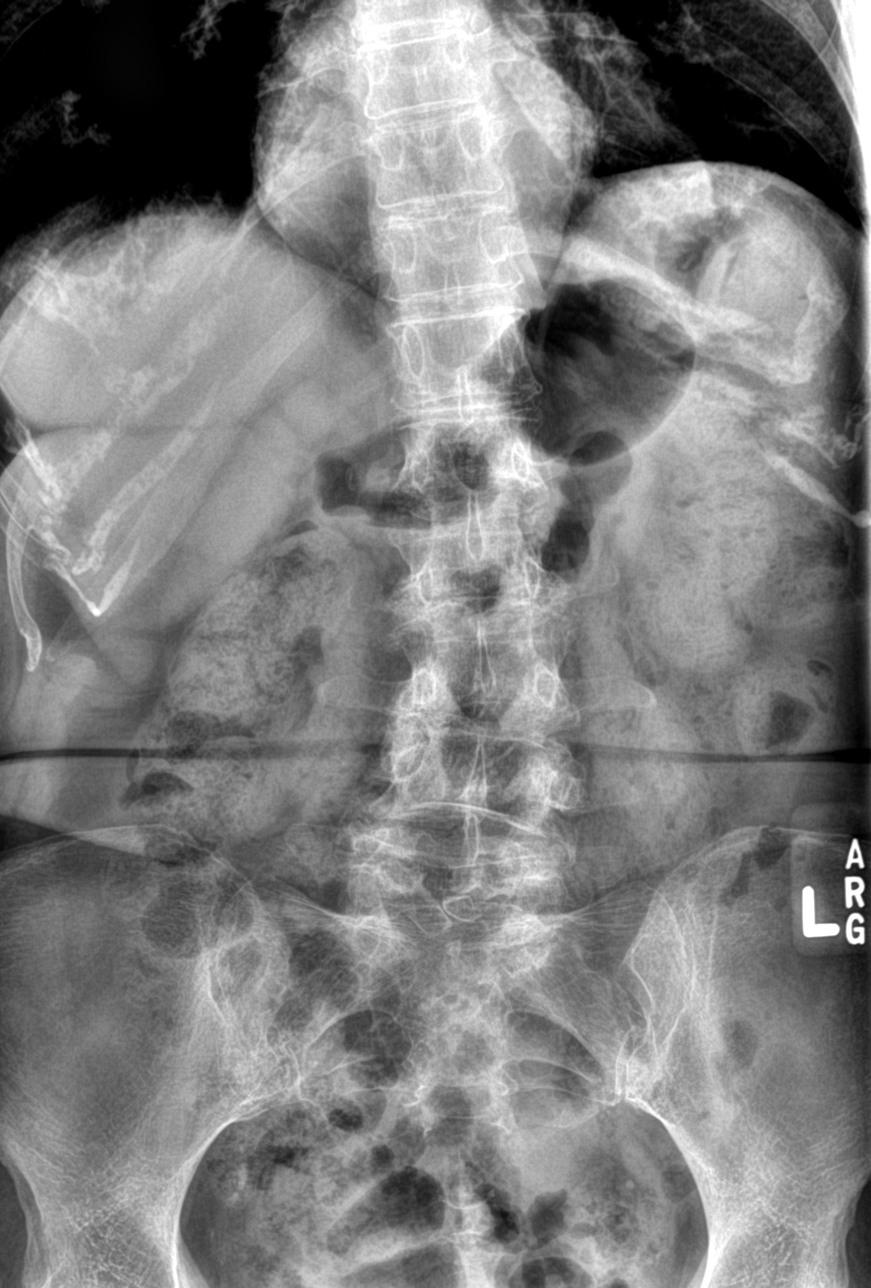
[im 2/4]
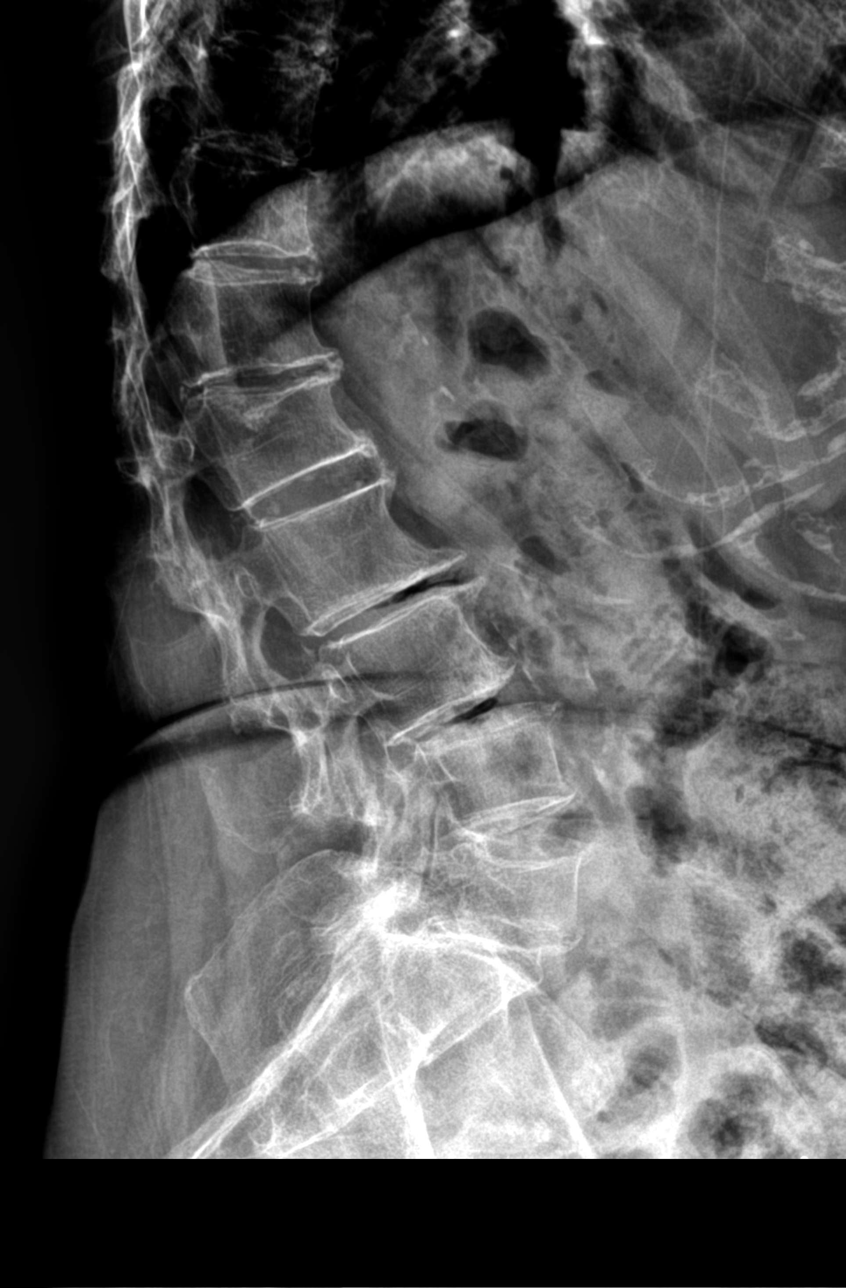
[im 3/4]
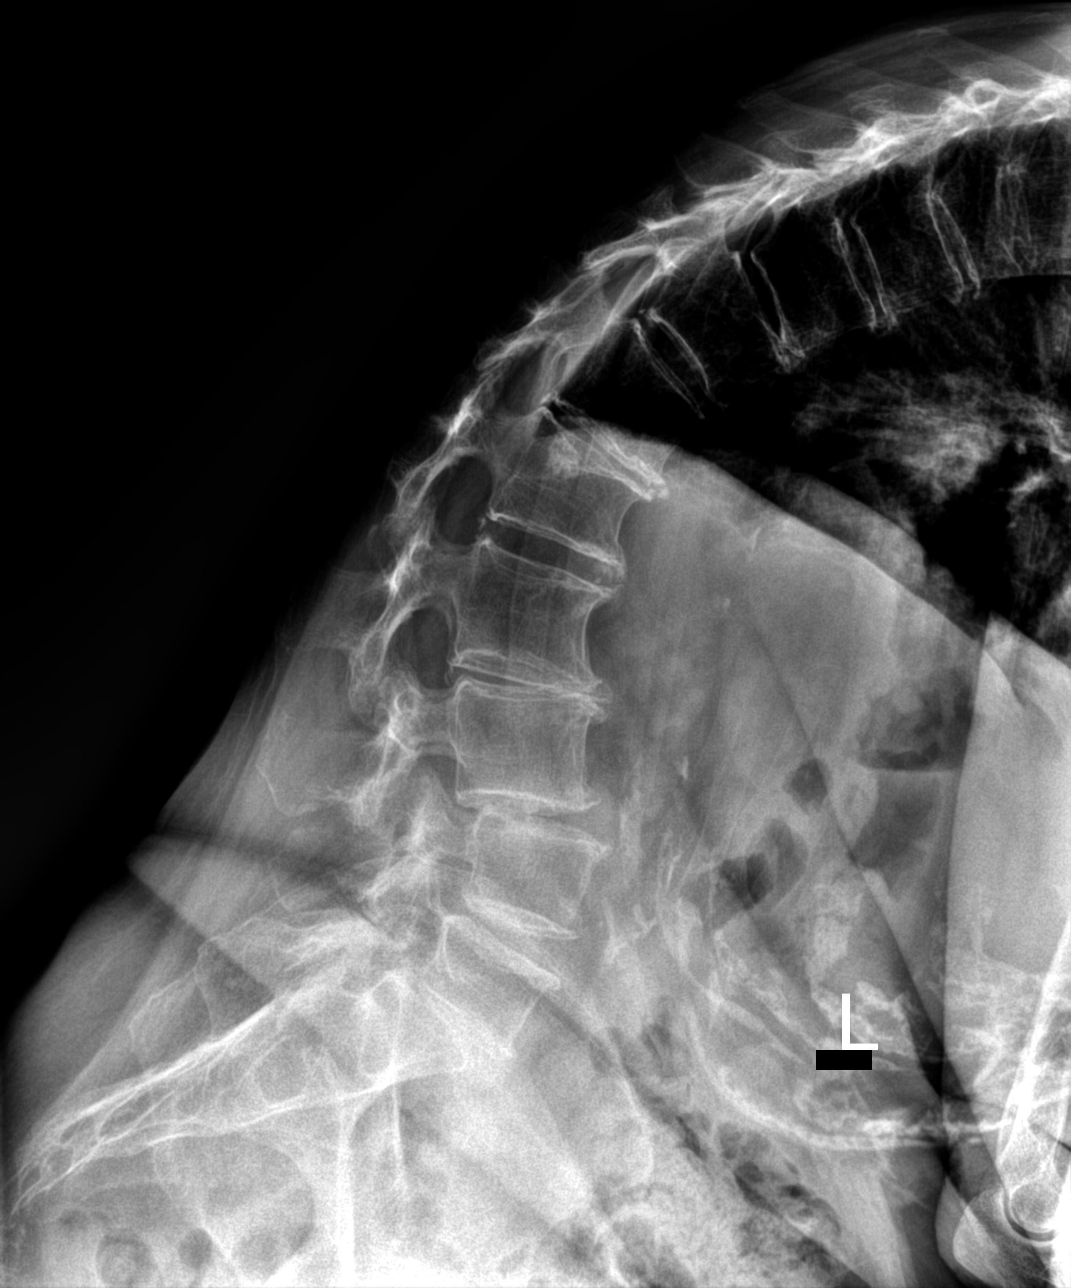
[im 4/4]
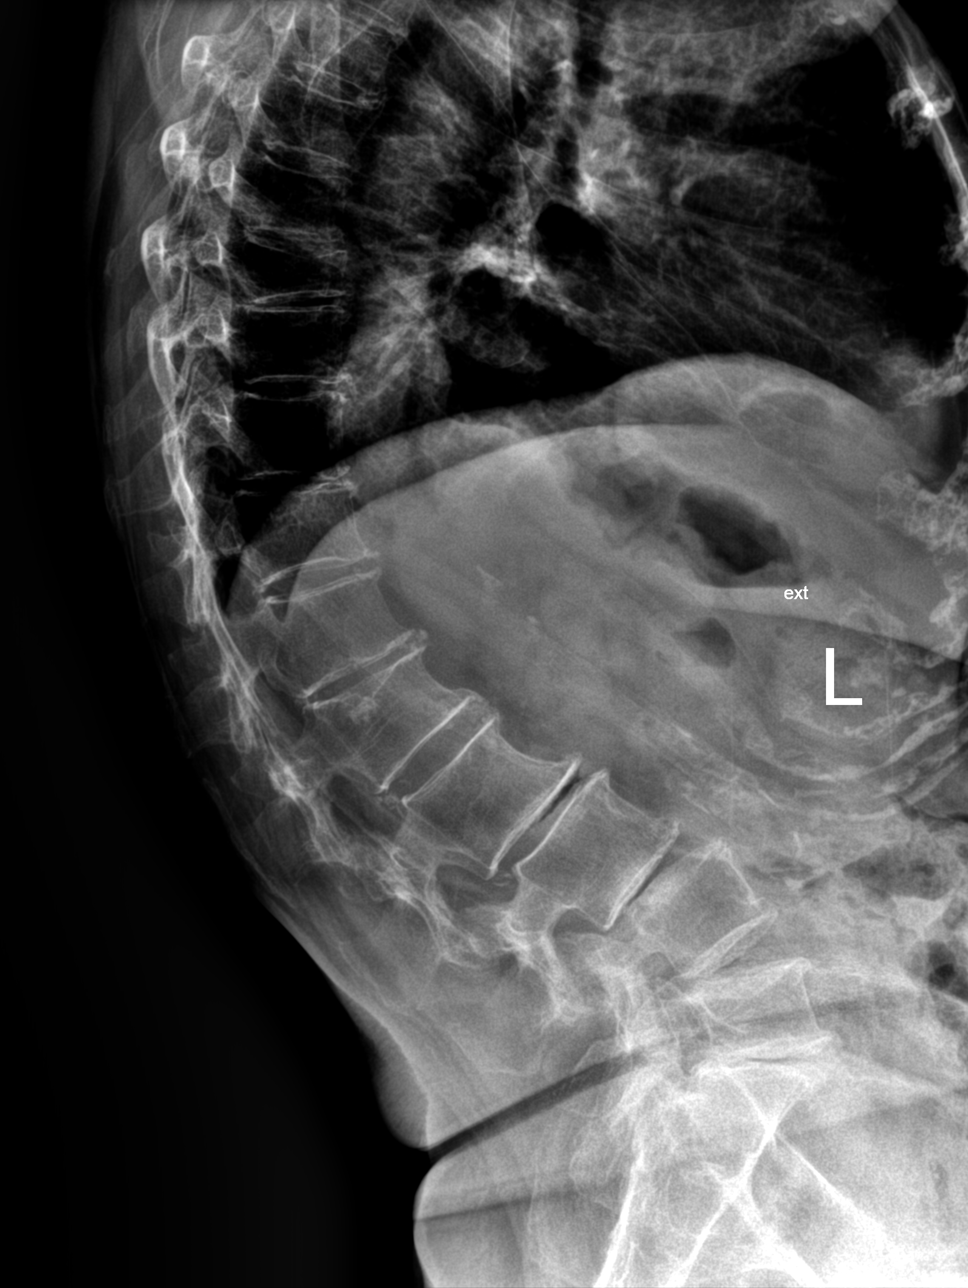

[4 of 4 positions shown; findings below may reference images not displayed]

FINDINGS: Scoliosis of lumbar spine with diffuse osteopenia.  Stable mild compression of upper endplate of L5 vertebra.  Severe degenerative disc changes at L2-3, L3-4 and L4-5 levels.  Overall, findings are unchanged from prior study.
IMPRESSION: 1. Osteopenic bones with mild compression of upper endplate of L5 vertebra. Severe multilevel degenerative disc changes.  Retrolisthesis of L3 on L4.

2. Overall, findings are unchanged from previous examination.  If symptoms are persistent, further evaluation with MRI is suggested.

3. Please correlate with DEXA densitometry.

## 2022-12-05 MED ORDER — PREDNISONE 20 MG TABLET
20.0000 mg | ORAL_TABLET | Freq: Every day | ORAL | 0 refills | Status: DC
Start: 2022-12-05 — End: 2023-02-01

## 2022-12-05 MED ORDER — AMOXICILLIN 875 MG-POTASSIUM CLAVULANATE 125 MG TABLET
1.0000 | ORAL_TABLET | Freq: Two times a day (BID) | ORAL | 0 refills | Status: DC
Start: 2022-12-05 — End: 2022-12-13

## 2022-12-07 ENCOUNTER — Encounter (INDEPENDENT_AMBULATORY_CARE_PROVIDER_SITE_OTHER): Payer: Self-pay | Admitting: Family

## 2022-12-07 DIAGNOSIS — D0511 Intraductal carcinoma in situ of right breast: Secondary | ICD-10-CM

## 2022-12-07 IMAGING — MG 3D DX MAMMO UNI AND TOMO
3 series · 3 of 12 positions shown · non-contrast
Comparison: Previous mammograms dated 02/15/2022, 11/28/2020.

------------- REPORT [id number] -------------
[facility]
[address]
We wish to report the following on your recent mammography examination. We are sending a report to your referring physician or other health care provider.
INDICATION: Prior right mastectomy in 7292 for cancer.

[L]
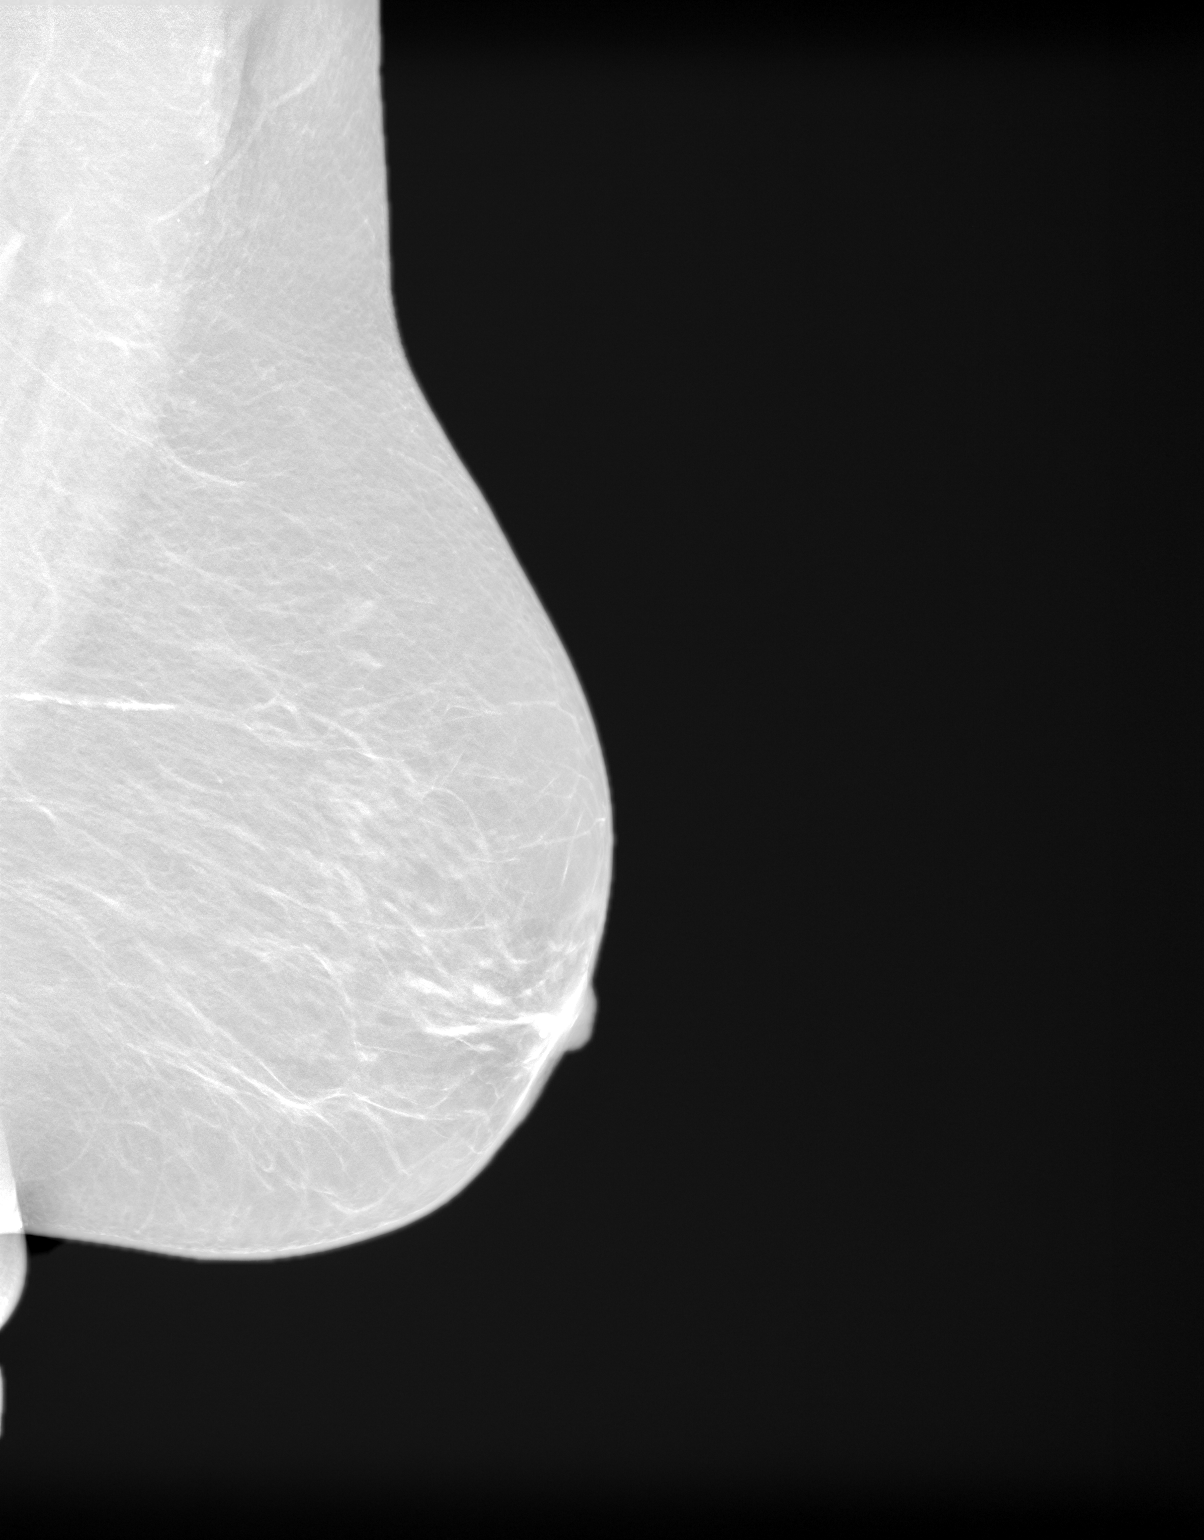

[L CC tomo]
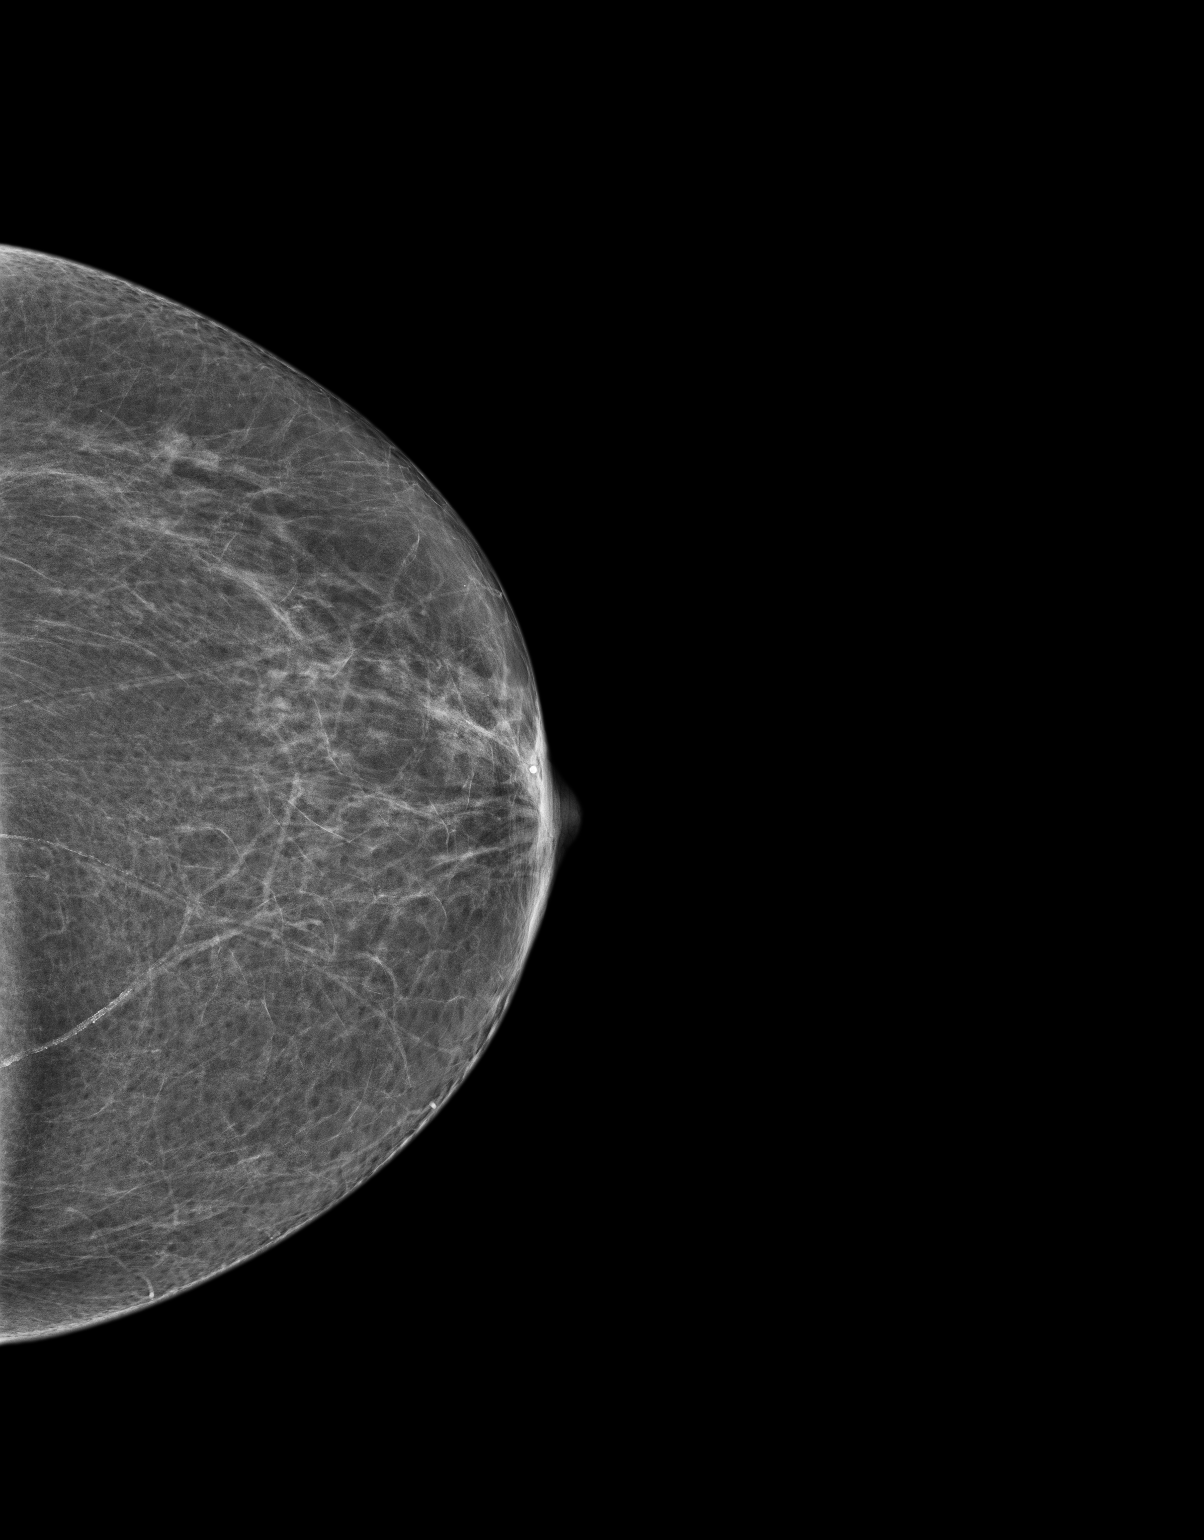

[3D DX MAMMO UNI AND TOMO tomo · tomo slice 9/56.0]
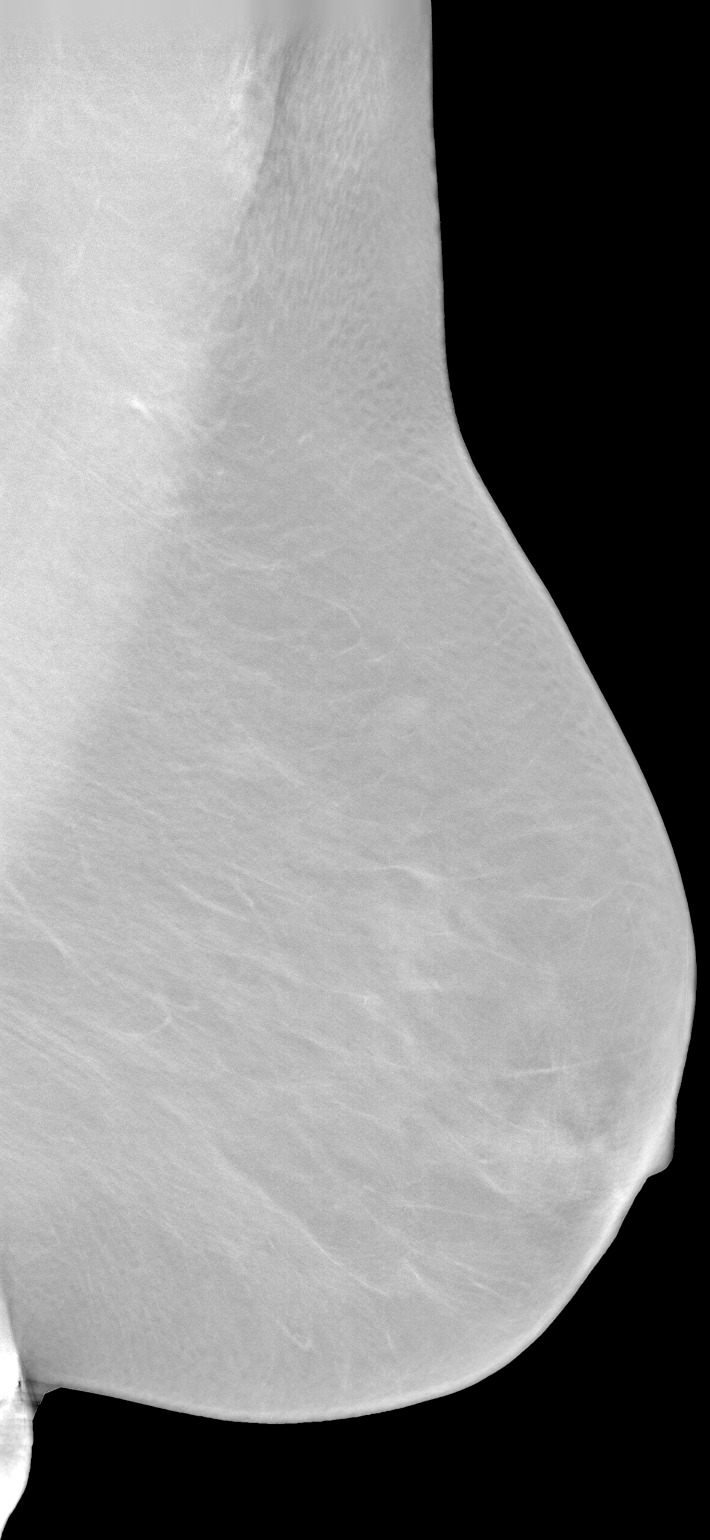

[3 of 12 positions shown; findings below may reference images not displayed]

FINDING: Normal-no evidence of cancer

This statement is mandated by the [place], Department of Health.
Your examination was performed by one of our technologists, who are registered radiological technologists and also specially certified in mammography:
___
[place], [place] (M)

Your mammogram was interpreted by our radiologist.

( 
Amore Pho, M.D.

(Annual Breast Examination by a physician or other health care provider
(Annual Mammography Screening beginning at age 40
(Monthly Breast Self Examination

------------- REPORT [id number] -------------
﻿

EXAM:  3D DX MAMMO UNI AND TOMO
FINDINGS: No focal mass or architectural changes are noted.  No abnormal calcific densities, skin change, nipple change or duct dilation are seen.
IMPRESSION: 1.  BIRADS 2-Benign findings. Patient has been added in a reminder system with a target date for the next screening mammography.

2.  DENSITY CODE –  A (Almost entirely fatty) 

Final Assessment Code:

BI-RADS 0
 Need additional imaging evaluation.

BI-RADS 1
 Negative mammogram.

BI-RADS 2
 Benign finding.

BI-RADS 3
 Probably benign finding; short-interval follow-up suggested.

BI-RADS 4
 Suspicious abnormality; biopsy should be considered.

BI-RADS 5
 Highly suggestive of malignancy; appropriate action should be taken.

BI-RADS 6
 Known biopsy-proven malignancy; appropriate action should be taken.

NOTE:
In compliance with Federal regulations, the results of this mammogram are being sent to the patient.

## 2022-12-08 ENCOUNTER — Other Ambulatory Visit (INDEPENDENT_AMBULATORY_CARE_PROVIDER_SITE_OTHER): Payer: Self-pay | Admitting: Family

## 2022-12-08 ENCOUNTER — Encounter (HOSPITAL_COMMUNITY): Payer: Self-pay | Admitting: NURSE PRACTITIONER

## 2022-12-08 ENCOUNTER — Other Ambulatory Visit: Payer: Self-pay

## 2022-12-08 ENCOUNTER — Other Ambulatory Visit: Payer: Medicare Other | Attending: NURSE PRACTITIONER

## 2022-12-08 DIAGNOSIS — D508 Other iron deficiency anemias: Secondary | ICD-10-CM | POA: Insufficient documentation

## 2022-12-08 DIAGNOSIS — E039 Hypothyroidism, unspecified: Secondary | ICD-10-CM | POA: Insufficient documentation

## 2022-12-08 DIAGNOSIS — E785 Hyperlipidemia, unspecified: Secondary | ICD-10-CM | POA: Insufficient documentation

## 2022-12-08 LAB — CBC WITH DIFF
BASOPHIL #: 0.1 10*3/uL (ref 0.00–0.10)
BASOPHIL %: 1 % (ref 0–1)
EOSINOPHIL #: 0.1 10*3/uL (ref 0.00–0.50)
EOSINOPHIL %: 1 %
HCT: 40.3 % (ref 31.2–41.9)
HGB: 12.6 g/dL (ref 10.9–14.3)
LYMPHOCYTE #: 2 10*3/uL (ref 1.00–3.00)
LYMPHOCYTE %: 24 % (ref 16–44)
MCH: 24.1 pg — ABNORMAL LOW (ref 24.7–32.8)
MCHC: 31.4 g/dL — ABNORMAL LOW (ref 32.3–35.6)
MCV: 76.7 fL (ref 75.5–95.3)
MONOCYTE #: 0.5 10*3/uL (ref 0.30–1.00)
MONOCYTE %: 6 % (ref 5–13)
MPV: 8.9 fL (ref 7.9–10.8)
NEUTROPHIL #: 5.7 10*3/uL (ref 1.85–7.80)
NEUTROPHIL %: 68 % (ref 43–77)
PLATELET COMMENT: NORMAL
PLATELETS: 413 10*3/uL (ref 140–440)
RBC: 5.26 10*6/uL — ABNORMAL HIGH (ref 3.63–4.92)
RDW: 26.7 % — ABNORMAL HIGH (ref 12.3–17.7)
WBC: 8.4 10*3/uL (ref 3.8–11.8)

## 2022-12-08 LAB — COMPREHENSIVE METABOLIC PNL, FASTING
ALBUMIN/GLOBULIN RATIO: 1.7 — ABNORMAL HIGH (ref 0.8–1.4)
ALBUMIN: 4.6 g/dL (ref 3.5–5.7)
ALKALINE PHOSPHATASE: 58 U/L (ref 34–104)
ALT (SGPT): 30 U/L (ref 7–52)
ANION GAP: 11 mmol/L (ref 4–13)
AST (SGOT): 28 U/L (ref 13–39)
BILIRUBIN TOTAL: 0.4 mg/dL (ref 0.3–1.2)
BUN/CREA RATIO: 20 (ref 6–22)
BUN: 18 mg/dL (ref 7–25)
CALCIUM, CORRECTED: 9.6 mg/dL (ref 8.9–10.8)
CALCIUM: 10.1 mg/dL (ref 8.6–10.3)
CHLORIDE: 105 mmol/L (ref 98–107)
CO2 TOTAL: 25 mmol/L (ref 21–31)
CREATININE: 0.9 mg/dL (ref 0.60–1.30)
ESTIMATED GFR: 68 mL/min/{1.73_m2} (ref >59)
GLOBULIN: 2.7 — ABNORMAL LOW (ref 2.9–5.4)
GLUCOSE: 122 mg/dL — ABNORMAL HIGH (ref 74–109)
OSMOLALITY, CALCULATED: 285 mOsm/kg (ref 270–290)
POTASSIUM: 3.3 mmol/L — ABNORMAL LOW (ref 3.5–5.1)
PROTEIN TOTAL: 7.3 g/dL (ref 6.4–8.9)
SODIUM: 141 mmol/L (ref 136–145)

## 2022-12-08 LAB — LIPID PANEL
CHOL/HDL RATIO: 3.1
CHOLESTEROL: 166 mg/dL (ref <200)
HDL CHOL: 54 mg/dL (ref 23–92)
LDL CALC: 73 mg/dL (ref 0–100)
TRIGLYCERIDES: 195 mg/dL — ABNORMAL HIGH (ref <=150)
VLDL CALC: 39 mg/dL (ref 0–50)

## 2022-12-08 LAB — THYROID STIMULATING HORMONE WITH FREE T4 REFLEX: TSH: 1.442 u[IU]/mL (ref 0.450–5.330)

## 2022-12-08 MED ORDER — POTASSIUM CHLORIDE ER 20 MEQ TABLET,EXTENDED RELEASE(PART/CRYST)
20.0000 meq | ORAL_TABLET | Freq: Every day | ORAL | 0 refills | Status: AC
Start: 2022-12-08 — End: 2022-12-13

## 2022-12-13 ENCOUNTER — Encounter (HOSPITAL_COMMUNITY): Payer: Self-pay | Admitting: NURSE PRACTITIONER

## 2022-12-13 ENCOUNTER — Ambulatory Visit: Payer: Medicare Other | Attending: NURSE PRACTITIONER | Admitting: NURSE PRACTITIONER

## 2022-12-13 ENCOUNTER — Other Ambulatory Visit: Payer: Self-pay

## 2022-12-13 ENCOUNTER — Ambulatory Visit (HOSPITAL_COMMUNITY): Payer: Self-pay | Admitting: NURSE PRACTITIONER

## 2022-12-13 VITALS — BP 116/62 | HR 79 | Temp 97.7°F | Ht 63.0 in | Wt 137.7 lb

## 2022-12-13 DIAGNOSIS — D0511 Intraductal carcinoma in situ of right breast: Secondary | ICD-10-CM

## 2022-12-13 DIAGNOSIS — R5383 Other fatigue: Secondary | ICD-10-CM | POA: Insufficient documentation

## 2022-12-13 DIAGNOSIS — D508 Other iron deficiency anemias: Secondary | ICD-10-CM | POA: Insufficient documentation

## 2022-12-13 DIAGNOSIS — Z923 Personal history of irradiation: Secondary | ICD-10-CM | POA: Insufficient documentation

## 2022-12-13 DIAGNOSIS — Z87891 Personal history of nicotine dependence: Secondary | ICD-10-CM | POA: Insufficient documentation

## 2022-12-13 DIAGNOSIS — C50011 Malignant neoplasm of nipple and areola, right female breast: Secondary | ICD-10-CM

## 2022-12-13 DIAGNOSIS — Z853 Personal history of malignant neoplasm of breast: Secondary | ICD-10-CM | POA: Insufficient documentation

## 2022-12-13 DIAGNOSIS — Z801 Family history of malignant neoplasm of trachea, bronchus and lung: Secondary | ICD-10-CM | POA: Insufficient documentation

## 2022-12-13 DIAGNOSIS — L729 Follicular cyst of the skin and subcutaneous tissue, unspecified: Secondary | ICD-10-CM | POA: Insufficient documentation

## 2022-12-13 NOTE — Cancer Center Note (Signed)
Katherine [name]  [id number]  January 30, 1951   12/13/2022       Department of Hematology/Oncology  Return Patient Visit           REFERRING PROVIDER:  Merry Lofty, FNP-BC  [address]  [place],  [place] [address]      REASON FOR OFFICE VISIT:  Ongoing management and evaluation of  Right DCIS and iron deficiency anemia.      HISTORY OF PRESENT ILLNESS:  Katherine [name] is a 72 y.o. female who presents to today alone for evaluation of DCIS and Iron deficiency.  Patient had 2 units of PRBC's transfused for a Hgb of 5.4 g/dl.  She has completed Injectafer treatment.    Her last Hgb was 12.6 g/dl.   She denies any PICA at this time.  She still has fatigue and does not feel like she is back at baseline.  She had a unilateral diagnostic mammogram that was normal per patient.   She reports she will be having an injection for a cyst in her back.    She denies any blood in her stool.   The patient has had a good appetite and stable weight.  The patient has not noted any new areas of disease on own self exam.  The patient has not had any chest pain or dyspnea.  The patient has not had any headaches or changes in vision.  The patient has not had any abdominal pain, nausea, or vomiting.  There have been no changes in bowel or bladder habits.  The patient has not had any abnormal bleeding or clotting episodes.  There have been no complaints of fever, chills, cough, sputum production, dysuria, or diarrhea.      Initial Diagnosis:  04/1998-  Right breast Paget's Disease with DCIS   02/08/2000 Right breast Paget's disease reoccurrence DCIS    Surgeries:  6/28/199 Right breast segmental resection with positive margins.   06/1998  Right breast wide marginal incision with questionable focus of residual DCIS  02/23/2000 right breast mastectomy    Treatment History:  Tamoxifen 10 mg PO BID for 5 years.   Radiation therapy 5040 cGy in 28 fractions over six weeks.     REVIEW OF SYSTEMS:  Review of Systems   Constitutional:  Positive for fatigue  (better than she was but not at baesline). Negative for chills and fever.        Denies PICA   HENT:  Negative.     Eyes: Negative.    Respiratory:  Positive for cough (occasional). Negative for shortness of breath.    Cardiovascular: Negative.  Negative for chest pain.   Gastrointestinal: Negative.  Negative for blood in stool, diarrhea, nausea and vomiting.   Endocrine: Negative.    Genitourinary: Negative.  Negative for difficulty urinating.    Musculoskeletal: Negative.  Negative for arthralgias and back pain.   Skin: Negative.    Neurological: Negative.  Negative for dizziness and headaches.   Hematological: Negative.  Negative for adenopathy. Does not bruise/bleed easily.   Psychiatric/Behavioral: Negative.          Past Medical History:   Diagnosis Date    Anemia     B12 deficiency     Esophageal reflux     Essential hypertension     Hx of breast cancer     Hx of transfusion     Hypercholesterolemia     Osteoporosis     Thyroid disease     Vitamin D deficiency  Past Surgical History:   Procedure Laterality Date    HX BREAST LUMPECTOMY      HX COLONOSCOPY      HX RADICAL MASTECTOMY      HX TUBAL LIGATION             Social History     Socioeconomic History    Marital status: Married     Spouse name: Not on file    Number of children: Not on file    Years of education: Not on file    Highest education level: Not on file   Occupational History    Not on file   Tobacco Use    Smoking status: Former     Types: Cigarettes    Smokeless tobacco: Never   Vaping Use    Vaping Use: Never used   Substance and Sexual Activity    Alcohol use: Never    Drug use: Never    Sexual activity: Not on file   Other Topics Concern    Not on file   Social History Narrative    Not on file     Social Determinants of Health     Financial Resource Strain: Low Risk  (09/27/2022)    Financial Resource Strain     SDOH Financial: No   Transportation Needs: Low Risk  (09/27/2022)    Transportation Needs     SDOH Transportation: No    Social Connections: Low Risk  (09/27/2022)    Social Connections     SDOH Social Isolation: 5 or more times a week   Intimate Partner Violence: High Risk (09/27/2022)    Intimate Partner Violence     SDOH Domestic Violence: No   Housing Stability: Low Risk  (09/27/2022)    Housing Stability     SDOH Housing Situation: I have housing.     SDOH Housing Worry: No       Social History     Social History Narrative    Not on file       Social History     Substance and Sexual Activity   Drug Use Never       Family Medical History:       Problem Relation (Age of Onset)    Blood Clots Daughter    Hypertension (High Blood Pressure) Mother    Lung Cancer Father    Thyroid Disease Mother              Current Outpatient Medications   Medication Sig    albuterol sulfate (PROVENTIL OR VENTOLIN OR PROAIR) 90 mcg/actuation Inhalation oral inhaler Take 1-2 Puffs by inhalation Every 6 hours as needed    cyanocobalamin (VITAMIN B12) 2,500 mcg Sublingual Tablet, Sublingual Place 1 Tablet (2,500 mcg total) under the tongue Once a day    ergocalciferol, vitamin D2, (DRISDOL) 1,250 mcg (50,000 unit) Oral Capsule Take 1 Capsule (50,000 Units total) by mouth Every 7 days    Ibuprofen (MOTRIN) 800 mg Oral Tablet TAKE 1 TABLET BY MOUTH ONCE DAILY AS NEEDED WITH FOOD    levothyroxine (SYNTHROID) 75 mcg Oral Tablet Take 1 Tablet (75 mcg total) by mouth Every morning    lisinopriL (PRINIVIL) 10 mg Oral Tablet 1 Tablet (10 mg total) Once a day Takes 1.5 tables  so she has been doing 15 mg.    methocarbamoL (ROBAXIN) 500 mg Oral Tablet Take 1 Tablet (500 mg total) by mouth Four times a day    potassium chloride (K-DUR) 20 mEq Oral Tab  Sust.Rel. Particle/Crystal Take 1 Tablet (20 mEq total) by mouth Once a day for 5 days    predniSONE (DELTASONE) 20 mg Oral Tablet Take 1 Tablet (20 mg total) by mouth Once a day       Allergies   Allergen Reactions    Boniva [Ibandronate]     Lipitor [Atorvastatin]          PHYSICAL EXAM:  BP 116/62 (Site: Left,  Patient Position: Sitting, Cuff Size: Adult)   Pulse 79   Temp 36.5 C (97.7 F) (Temporal)   Ht 1.6 m ('5\' 3"'$ )   Wt 62.5 kg (137 lb 11.2 oz)   SpO2 97%   BMI 24.39 kg/m        ECOG Status: (0) Fully active, able to carry on all predisease performance without restriction   Physical Exam  Vitals reviewed.   Constitutional:       Appearance: Normal appearance. She is normal weight.   HENT:      Head: Normocephalic.   Eyes:      Conjunctiva/sclera: Conjunctivae normal.      Pupils: Pupils are equal, round, and reactive to light.   Neck:      Thyroid: No thyroid mass, thyromegaly or thyroid tenderness.   Cardiovascular:      Rate and Rhythm: Normal rate and regular rhythm.      Pulses: Normal pulses.      Heart sounds: Normal heart sounds, S1 normal and S2 normal. No murmur heard.     No S3 or S4 sounds.   Pulmonary:      Effort: Pulmonary effort is normal.      Breath sounds: Normal breath sounds.   Chest:      Comments: Patient declines breast exam    Abdominal:      General: Bowel sounds are normal.      Palpations: Abdomen is soft.   Musculoskeletal:         General: Normal range of motion.      Cervical back: Normal range of motion and neck supple.      Right lower leg: No edema.      Left lower leg: No edema.   Lymphadenopathy:      Head:      Right side of head: No submental, submandibular, posterior auricular or occipital adenopathy.      Left side of head: No submental, submandibular, posterior auricular or occipital adenopathy.      Cervical: No cervical adenopathy.      Right cervical: No superficial, deep or posterior cervical adenopathy.     Left cervical: No superficial, deep or posterior cervical adenopathy.      Upper Body:      Right upper body: No supraclavicular or axillary adenopathy.      Left upper body: No supraclavicular or axillary adenopathy.      Lower Body: No right inguinal adenopathy. No left inguinal adenopathy.   Skin:     General: Skin is warm and dry.      Capillary Refill:  Capillary refill takes less than 2 seconds.   Neurological:      General: No focal deficit present.      Mental Status: She is alert. Mental status is at baseline.      Sensory: Sensation is intact.      Motor: Motor function is intact.      Coordination: Coordination is intact.      Gait: Gait is intact.   Psychiatric:  Attention and Perception: Attention and perception normal.         Mood and Affect: Mood and affect normal.         Speech: Speech normal.         Behavior: Behavior normal. Behavior is cooperative.         Thought Content: Thought content normal.         Cognition and Memory: Cognition and memory normal.         Judgment: Judgment normal.         Radiology:   Mammogram 2023 at [facility]    LABS:         CBC  Diff   Lab Results   Component Value Date/Time    WBC 8.4 12/08/2022 12:06 PM    HGB 12.6 12/08/2022 12:06 PM    HCT 40.3 12/08/2022 12:06 PM    PLTCNT 413 12/08/2022 12:06 PM    RBC 5.26 (H) 12/08/2022 12:06 PM    MCV 76.7 12/08/2022 12:06 PM    MCHC 31.4 (L) 12/08/2022 12:06 PM    MCH 24.1 (L) 12/08/2022 12:06 PM    RDW 26.7 (H) 12/08/2022 12:06 PM    MPV 8.9 12/08/2022 12:06 PM    Lab Results   Component Value Date/Time    PMNS 68 12/08/2022 12:06 PM    LYMPHOCYTES 24 12/08/2022 12:06 PM    EOSINOPHIL 1 12/08/2022 12:06 PM    MONOCYTES 6 12/08/2022 12:06 PM    BASOPHILS 1 12/08/2022 12:06 PM    BASOPHILS 0.10 12/08/2022 12:06 PM    PMNABS 5.70 12/08/2022 12:06 PM    LYMPHSABS 2.00 12/08/2022 12:06 PM    EOSABS 0.10 12/08/2022 12:06 PM    MONOSABS 0.50 12/08/2022 12:06 PM            Comprehensive Metabolic Profile    Lab Results   Component Value Date    SODIUM 141 12/08/2022    POTASSIUM 3.3 (L) 12/08/2022    CHLORIDE 105 12/08/2022    CO2 25 12/08/2022    ANIONGAP 11 12/08/2022    BUN 18 12/08/2022    CREATININE 0.90 12/08/2022    ALBUMIN 4.6 12/08/2022    CALCIUM 10.1 12/08/2022    GLUCOSENF 122 (H) 12/08/2022    ALKPHOS 58 12/08/2022    ALT 30 12/08/2022    AST 28  12/08/2022    TOTBILIRUBIN 0.4 12/08/2022    TOTALPROTEIN 7.3 12/08/2022              IRON   Date Value Ref Range Status   11/16/2022 <10 (L) 50 - 212 ug/dL Final     FERRITIN   Date Value Ref Range Status   11/16/2022 10 (L) 11 - 336 ng/mL Final     TOTAL IRON BINDING CAPACITY   Date Value Ref Range Status   11/16/2022 557 (H) 250 - 450 ug/dL Final     UIBC   Date Value Ref Range Status   11/16/2022   Final     Comment:     Calculation not performed.        IRON (TRANSFERRIN) SATURATION   Date Value Ref Range Status   11/16/2022   Final     Comment:     Calculation not performed.                  ASSESSMENT:    ICD-10-CM    1. Iron deficiency anemia secondary to inadequate dietary iron intake  D50.8 COMPREHENSIVE METABOLIC  PANEL, NON-FASTING     IRON TRANSFERRIN AND TIBC     FERRITIN     CBC/DIFF      2. 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 Ductal carcinoma in situ (DCIS) of right breast  D05.11 COMPREHENSIVE METABOLIC PANEL, NON-FASTING     IRON TRANSFERRIN AND TIBC     FERRITIN     CBC/DIFF             PLAN:   1. Iron-deficiency anemia:  replaced at this time.  Symptoms have resolved except fatigue at this time.  I will recheck her labs in a couple of weeks.   2.  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  She continues to have yearly mammograms as scheduled.  3. Former smoker of approximately 20 pack years.  I did discuss with the patient low-dose CT scanning.   Patient did not quailify under the guidelines at that time due to  quitting smoking in the 1980's.   Guidelines recently changed and the <15 year requirement has been removed.  Once this is in full effect I will order a low dose CT scan.     Return in about 6 weeks (around 01/24/2023).     Katherine [name] was given the chance to ask questions, and these were answered to their  satisfaction. The patient is welcome to call with any questions or concerns in the meantime.     On the day of the encounter, I spent a total of 21 minutes on this patient encounter including review of historical information, examination, documentation and post-visit activities.     Patrcia Dolly APRN, FNP-BC, AOCNP, 12/13/2022 , 09:26     You can see your note(s) in [email or web address]. It is common for you to encounter certain medical terminology which may be unfamiliar to you. You might see results before your provider does so please give at least 2 business days for review. Please have this understanding, that NOT all abnormal results are significant. Our office will contact you for any urgent or emergent action if necessary. If you have any questions or concerns, feel free to send a MyChart message or call the office. Please call with any new or concerning symptoms.       This note was partially generated using M[employer] Fluency Direct system, and there may be some incorrect words, spellings, and punctuation that were not noted in checking the note before saving.     CC:  [employer], FNP-BC  [place] [address]  [place] [id number]

## 2022-12-22 ENCOUNTER — Other Ambulatory Visit (INDEPENDENT_AMBULATORY_CARE_PROVIDER_SITE_OTHER): Payer: Self-pay | Admitting: Family

## 2022-12-22 MED ORDER — LISINOPRIL 10 MG TABLET
10.0000 mg | ORAL_TABLET | Freq: Every day | ORAL | 2 refills | Status: DC
Start: 2022-12-22 — End: 2023-02-01

## 2023-01-10 ENCOUNTER — Ambulatory Visit (HOSPITAL_COMMUNITY): Payer: Self-pay | Admitting: Neurological Surgery

## 2023-01-10 ENCOUNTER — Encounter (HOSPITAL_BASED_OUTPATIENT_CLINIC_OR_DEPARTMENT_OTHER): Payer: Medicare Other | Admitting: Neurological Surgery

## 2023-01-10 DIAGNOSIS — M5410 Radiculopathy, site unspecified: Secondary | ICD-10-CM

## 2023-01-23 ENCOUNTER — Other Ambulatory Visit: Payer: Medicare Other | Attending: NURSE PRACTITIONER

## 2023-01-23 ENCOUNTER — Other Ambulatory Visit: Payer: Self-pay

## 2023-01-23 DIAGNOSIS — C50011 Malignant neoplasm of nipple and areola, right female breast: Secondary | ICD-10-CM | POA: Insufficient documentation

## 2023-01-23 DIAGNOSIS — D508 Other iron deficiency anemias: Secondary | ICD-10-CM | POA: Insufficient documentation

## 2023-01-23 DIAGNOSIS — D0511 Intraductal carcinoma in situ of right breast: Secondary | ICD-10-CM | POA: Insufficient documentation

## 2023-01-23 LAB — IRON TRANSFERRIN AND TIBC
IRON (TRANSFERRIN) SATURATION: 45 % (ref 15–50)
IRON: 152 ug/dL (ref 50–212)
TOTAL IRON BINDING CAPACITY: 335 ug/dL (ref 250–450)
TRANSFERRIN: 239 mg/dL (ref 203–362)
UIBC: 183 ug/dL (ref 130–375)

## 2023-01-23 LAB — CBC WITH DIFF
BASOPHIL #: 0.1 10*3/uL (ref 0.00–0.10)
BASOPHIL %: 1 % (ref 0–1)
EOSINOPHIL #: 0.2 10*3/uL (ref 0.00–0.50)
EOSINOPHIL %: 3 %
HCT: 43.2 % — ABNORMAL HIGH (ref 31.2–41.9)
HGB: 14.4 g/dL — ABNORMAL HIGH (ref 10.9–14.3)
LYMPHOCYTE #: 1.5 10*3/uL (ref 1.00–3.00)
LYMPHOCYTE %: 25 % (ref 16–44)
MCH: 27.3 pg (ref 24.7–32.8)
MCHC: 33.3 g/dL (ref 32.3–35.6)
MCV: 82 fL (ref 75.5–95.3)
MONOCYTE #: 0.5 10*3/uL (ref 0.30–1.00)
MONOCYTE %: 9 % (ref 5–13)
MPV: 8.9 fL (ref 7.9–10.8)
NEUTROPHIL #: 3.8 10*3/uL (ref 1.85–7.80)
NEUTROPHIL %: 63 % (ref 43–77)
PLATELET COMMENT: NORMAL
PLATELETS: 302 10*3/uL (ref 140–440)
RBC: 5.26 10*6/uL — ABNORMAL HIGH (ref 3.63–4.92)
RDW: 22.3 % — ABNORMAL HIGH (ref 12.3–17.7)
WBC: 6.1 10*3/uL (ref 3.8–11.8)

## 2023-01-23 LAB — COMPREHENSIVE METABOLIC PANEL, NON-FASTING
ALBUMIN/GLOBULIN RATIO: 1.8 — ABNORMAL HIGH (ref 0.8–1.4)
ALBUMIN: 4.3 g/dL (ref 3.5–5.7)
ALKALINE PHOSPHATASE: 53 U/L (ref 34–104)
ALT (SGPT): 12 U/L (ref 7–52)
ANION GAP: 6 mmol/L (ref 4–13)
AST (SGOT): 17 U/L (ref 13–39)
BILIRUBIN TOTAL: 0.4 mg/dL (ref 0.3–1.2)
BUN/CREA RATIO: 11 (ref 6–22)
BUN: 10 mg/dL (ref 7–25)
CALCIUM, CORRECTED: 10 mg/dL (ref 8.9–10.8)
CALCIUM: 10.2 mg/dL (ref 8.6–10.3)
CHLORIDE: 108 mmol/L — ABNORMAL HIGH (ref 98–107)
CO2 TOTAL: 27 mmol/L (ref 21–31)
CREATININE: 0.92 mg/dL (ref 0.60–1.30)
ESTIMATED GFR: 67 mL/min/{1.73_m2} (ref >59)
GLOBULIN: 2.4 — ABNORMAL LOW (ref 2.9–5.4)
GLUCOSE: 86 mg/dL (ref 74–109)
OSMOLALITY, CALCULATED: 280 mOsm/kg (ref 270–290)
POTASSIUM: 4.5 mmol/L (ref 3.5–5.1)
PROTEIN TOTAL: 6.7 g/dL (ref 6.4–8.9)
SODIUM: 141 mmol/L (ref 136–145)

## 2023-01-23 LAB — FERRITIN: FERRITIN: 228 ng/mL (ref 11–336)

## 2023-01-31 ENCOUNTER — Ambulatory Visit: Payer: Medicare Other | Attending: NURSE PRACTITIONER | Admitting: NURSE PRACTITIONER

## 2023-01-31 ENCOUNTER — Encounter (HOSPITAL_COMMUNITY): Payer: Self-pay | Admitting: NURSE PRACTITIONER

## 2023-01-31 ENCOUNTER — Other Ambulatory Visit: Payer: Self-pay

## 2023-01-31 VITALS — BP 114/64 | HR 73 | Temp 97.7°F | Ht 63.0 in | Wt 136.7 lb

## 2023-01-31 DIAGNOSIS — C50011 Malignant neoplasm of nipple and areola, right female breast: Secondary | ICD-10-CM

## 2023-01-31 DIAGNOSIS — Z853 Personal history of malignant neoplasm of breast: Secondary | ICD-10-CM | POA: Insufficient documentation

## 2023-01-31 DIAGNOSIS — Z87891 Personal history of nicotine dependence: Secondary | ICD-10-CM

## 2023-01-31 DIAGNOSIS — Z9011 Acquired absence of right breast and nipple: Secondary | ICD-10-CM | POA: Insufficient documentation

## 2023-01-31 DIAGNOSIS — D508 Other iron deficiency anemias: Secondary | ICD-10-CM

## 2023-01-31 NOTE — Cancer Center Note (Signed)
Katherine [name]  [id number]  [birth year]   01/31/2023       Department of Hematology/Oncology  Return Patient Visit           REFERRING PROVIDER:  Merry Lofty, FNP-BC  [address]  [place],  [place] [address]      REASON FOR OFFICE VISIT:  Ongoing management and evaluation of  Right DCIS and iron deficiency anemia.      HISTORY OF PRESENT ILLNESS:  Katherine [name] is a 72 y.o. female who presents to today alone for evaluation of DCIS and Iron deficiency.   Patient reports that she is feeling better after her infusion.  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   06/1998  Right breast wide marginal incision with questionable focus of residual DCIS  02/23/2000 right breast mastectomy    Treatment History:  Tamoxifen 10 mg PO BID for 5 years.   Radiation therapy 5040 cGy in 28 fractions over six weeks.     REVIEW OF SYSTEMS:  Review of Systems   Constitutional:  Negative for chills, fatigue and fever.        Denies PICA   HENT:  Negative.     Eyes: Negative.    Respiratory:  Negative for cough and shortness of breath.    Cardiovascular: Negative.  Negative for chest pain.   Gastrointestinal: Negative.  Negative for blood in stool, diarrhea, nausea and vomiting.   Endocrine: Negative.    Genitourinary: Negative.  Negative for difficulty urinating.    Musculoskeletal: Negative.  Negative for arthralgias and back pain.   Skin: Negative.    Neurological: Negative.  Negative for dizziness and headaches.   Hematological: Negative.  Negative for adenopathy. Does not bruise/bleed easily.   Psychiatric/Behavioral: Negative.          Past Medical History:   Diagnosis Date    Anemia     B12 deficiency     Esophageal reflux     Essential hypertension     Hx of breast cancer     Hx of transfusion     Hypercholesterolemia     Osteoporosis     Thyroid disease      Vitamin D deficiency            Past Surgical History:   Procedure Laterality Date    HX BREAST LUMPECTOMY      HX COLONOSCOPY      HX RADICAL MASTECTOMY      HX TUBAL LIGATION             Social History     Socioeconomic History    Marital status: Married     Spouse name: Not on file    Number of children: Not on file    Years of education: Not on file    Highest education level: Not on file   Occupational History    Not on file   Tobacco Use    Smoking status: Former     Types: Cigarettes    Smokeless tobacco: Never   Vaping Use    Vaping status: Never Used   Substance and Sexual Activity    Alcohol use: Never    Drug use: Never    Sexual activity: Not on file   Other Topics Concern    Not on file  Social History Narrative    Not on file     Social Determinants of Health     Financial Resource Strain: Low Risk  (09/27/2022)    Financial Resource Strain     SDOH Financial: No   Transportation Needs: Low Risk  (09/27/2022)    Transportation Needs     SDOH Transportation: No   Social Connections: Low Risk  (09/27/2022)    Social Connections     SDOH Social Isolation: 5 or more times a week   Intimate Partner Violence: High Risk (09/27/2022)    Intimate Partner Violence     SDOH Domestic Violence: No   Housing Stability: Low Risk  (09/27/2022)    Housing Stability     SDOH Housing Situation: I have housing.     SDOH Housing Worry: No       Social History     Social History Narrative    Not on file       Social History     Substance and Sexual Activity   Drug Use Never       Family Medical History:       Problem Relation (Age of Onset)    Blood Clots Daughter    Hypertension (High Blood Pressure) Mother    Lung Cancer Father    Thyroid Disease Mother              Current Outpatient Medications   Medication Sig    albuterol sulfate (PROVENTIL OR VENTOLIN OR PROAIR) 90 mcg/actuation Inhalation oral inhaler Take 1-2 Puffs by inhalation Every 6 hours as needed    cyanocobalamin (VITAMIN B12) 2,500 mcg Sublingual Tablet,  Sublingual Place 1 Tablet (2,500 mcg total) under the tongue Once a day    ergocalciferol, vitamin D2, (DRISDOL) 1,250 mcg (50,000 unit) Oral Capsule Take 1 Capsule (50,000 Units total) by mouth Every 7 days    Ibuprofen (MOTRIN) 800 mg Oral Tablet TAKE 1 TABLET BY MOUTH ONCE DAILY AS NEEDED WITH FOOD    levothyroxine (SYNTHROID) 75 mcg Oral Tablet Take 1 Tablet (75 mcg total) by mouth Every morning    lisinopriL (PRINIVIL) 10 mg Oral Tablet Take 1 Tablet (10 mg total) by mouth Once a day Takes 1.5 tables  so she has been doing 15 mg.    methocarbamoL (ROBAXIN) 500 mg Oral Tablet Take 1 Tablet (500 mg total) by mouth Four times a day    predniSONE (DELTASONE) 20 mg Oral Tablet Take 1 Tablet (20 mg total) by mouth Once a day       Allergies   Allergen Reactions    Boniva [Ibandronate]     Lipitor [Atorvastatin]          PHYSICAL EXAM:  BP 114/64 (Site: Left, Patient Position: Sitting, Cuff Size: Adult)   Pulse 73   Temp 36.5 C (97.7 F) (Temporal)   Ht 1.6 m (5\' 3" )   Wt 62 kg (136 lb 11.2 oz)   SpO2 99%   BMI 24.22 kg/m        ECOG Status: (0) Fully active, able to carry on all predisease performance without restriction   Physical Exam  Vitals reviewed.   Constitutional:       Appearance: Normal appearance. She is normal weight.   HENT:      Head: Normocephalic.   Eyes:      Conjunctiva/sclera: Conjunctivae normal.      Pupils: Pupils are equal, round, and reactive to light.   Neck:  Thyroid: No thyroid mass, thyromegaly or thyroid tenderness.   Cardiovascular:      Rate and Rhythm: Normal rate and regular rhythm.      Pulses: Normal pulses.      Heart sounds: Normal heart sounds, S1 normal and S2 normal. No murmur heard.     No S3 or S4 sounds.   Pulmonary:      Effort: Pulmonary effort is normal.      Breath sounds: Normal breath sounds.   Chest:      Comments: Patient declines breast exam    Abdominal:      General: Bowel sounds are normal.      Palpations: Abdomen is soft.   Musculoskeletal:          General: Normal range of motion.      Cervical back: Normal range of motion and neck supple.      Right lower leg: No edema.      Left lower leg: No edema.   Lymphadenopathy:      Head:      Right side of head: No submental, submandibular, posterior auricular or occipital adenopathy.      Left side of head: No submental, submandibular, posterior auricular or occipital adenopathy.      Cervical: No cervical adenopathy.      Right cervical: No superficial, deep or posterior cervical adenopathy.     Left cervical: No superficial, deep or posterior cervical adenopathy.      Upper Body:      Right upper body: No supraclavicular or axillary adenopathy.      Left upper body: No supraclavicular or axillary adenopathy.      Lower Body: No right inguinal adenopathy. No left inguinal adenopathy.   Skin:     General: Skin is warm and dry.      Capillary Refill: Capillary refill takes less than 2 seconds.   Neurological:      General: No focal deficit present.      Mental Status: She is alert. Mental status is at baseline.      Sensory: Sensation is intact.      Motor: Motor function is intact.      Coordination: Coordination is intact.      Gait: Gait is intact.   Psychiatric:         Attention and Perception: Attention and perception normal.         Mood and Affect: Mood and affect normal.         Speech: Speech normal.         Behavior: Behavior normal. Behavior is cooperative.         Thought Content: Thought content normal.         Cognition and Memory: Cognition and memory normal.         Judgment: Judgment normal.         Radiology:   Mammogram 2023 at [facility]    LABS:         CBC  Diff   Lab Results   Component Value Date/Time    WBC 6.1 01/23/2023 12:08 PM    HGB 14.4 (H) 01/23/2023 12:08 PM    HCT 43.2 (H) 01/23/2023 12:08 PM    PLTCNT 302 01/23/2023 12:08 PM    RBC 5.26 (H) 01/23/2023 12:08 PM    MCV 82.0 01/23/2023 12:08 PM    MCHC 33.3 01/23/2023 12:08 PM    MCH 27.3 01/23/2023 12:08 PM    RDW  22.3 (H)  01/23/2023 12:08 PM    MPV 8.9 01/23/2023 12:08 PM    Lab Results   Component Value Date/Time    PMNS 63 01/23/2023 12:08 PM    LYMPHOCYTES 25 01/23/2023 12:08 PM    EOSINOPHIL 3 01/23/2023 12:08 PM    MONOCYTES 9 01/23/2023 12:08 PM    BASOPHILS 1 01/23/2023 12:08 PM    BASOPHILS 0.10 01/23/2023 12:08 PM    PMNABS 3.80 01/23/2023 12:08 PM    LYMPHSABS 1.50 01/23/2023 12:08 PM    EOSABS 0.20 01/23/2023 12:08 PM    MONOSABS 0.50 01/23/2023 12:08 PM            Comprehensive Metabolic Profile    Lab Results   Component Value Date    SODIUM 141 01/23/2023    POTASSIUM 4.5 01/23/2023    CHLORIDE 108 (H) 01/23/2023    CO2 27 01/23/2023    ANIONGAP 6 01/23/2023    BUN 10 01/23/2023    CREATININE 0.92 01/23/2023    ALBUMIN 4.3 01/23/2023    CALCIUM 10.2 01/23/2023    GLUCOSENF 86 01/23/2023    ALKPHOS 53 01/23/2023    ALT 12 01/23/2023    AST 17 01/23/2023    TOTBILIRUBIN 0.4 01/23/2023    TOTALPROTEIN 6.7 01/23/2023              IRON   Date Value Ref Range Status   01/23/2023 152 50 - 212 ug/dL Final     FERRITIN   Date Value Ref Range Status   01/23/2023 228 11 - 336 ng/mL Final     TOTAL IRON BINDING CAPACITY   Date Value Ref Range Status   01/23/2023 335 250 - 450 ug/dL Final     UIBC   Date Value Ref Range Status   01/23/2023 183 130 - 375 ug/dL Final     IRON (TRANSFERRIN) SATURATION   Date Value Ref Range Status   01/23/2023 45 15 - 50 % Final                  ASSESSMENT:    ICD-10-CM    1. Iron deficiency anemia secondary to inadequate dietary iron intake  D50.8 COMPREHENSIVE METABOLIC PANEL, NON-FASTING     IRON TRANSFERRIN AND TIBC     FERRITIN     CBC/DIFF      2. Paget's disease of female breast, right (CMS HCC)  C50.011 FERRITIN     CBC/DIFF      3. Former smoker  Z87.891 CT LUNG SCREENING LDCT               PLAN:   1. Iron-deficiency anemia:  replaced at this time.  Asymptomatic at this time.   I will recheck her labs in a couple of weeks.   2.  Paget's disease of the right breast with DCIS:  Patient  has been free of disease since 2001.  She continues to have yearly mammograms as scheduled.  3. Former smoker of approximately 20 pack years.  I did discuss with the patient low-dose CT scanning.   Patient did not quailify under the guidelines at that time due to  quitting smoking in the 1980's.   Guidelines recently changed and the <15 year requirement has been removed.   I have ordered the low dose CT scan at this time.   Return in about 3 months (around 05/03/2023).     Katherine [name] was given the chance to ask questions, and these were answered to their satisfaction. The patient  is welcome to call with any questions or concerns in the meantime.     On the day of the encounter, I spent a total of 33 minutes on this patient encounter including review of historical information, examination, documentation and post-visit activities.     Patrcia Dolly APRN, FNP-BC, AOCNP, 01/31/2023 , 11:04     You can see your note(s) in [email or web address]. It is common for you to encounter certain medical terminology which may be unfamiliar to you. You might see results before your provider does so please give at least 2 business days for review. Please have this understanding, that NOT all abnormal results are significant. Our office will contact you for any urgent or emergent action if necessary. If you have any questions or concerns, feel free to send a MyChart message or call the office. Please call with any new or concerning symptoms.       This note was partially generated using MModal Fluency Direct system, and there may be some incorrect words, spellings, and punctuation that were not noted in checking the note before saving.     CC:  [employer], FNP-BC  [place] [address]  [place] [address]

## 2023-02-01 ENCOUNTER — Encounter (INDEPENDENT_AMBULATORY_CARE_PROVIDER_SITE_OTHER): Payer: Self-pay | Admitting: Family

## 2023-02-01 ENCOUNTER — Other Ambulatory Visit (INDEPENDENT_AMBULATORY_CARE_PROVIDER_SITE_OTHER): Payer: Self-pay | Admitting: Family

## 2023-02-01 ENCOUNTER — Ambulatory Visit (INDEPENDENT_AMBULATORY_CARE_PROVIDER_SITE_OTHER): Payer: Medicare Other | Admitting: Family

## 2023-02-01 VITALS — BP 130/51 | HR 76 | Ht 62.99 in | Wt 136.2 lb

## 2023-02-01 DIAGNOSIS — E039 Hypothyroidism, unspecified: Secondary | ICD-10-CM

## 2023-02-01 DIAGNOSIS — I10 Essential (primary) hypertension: Secondary | ICD-10-CM

## 2023-02-01 DIAGNOSIS — D508 Other iron deficiency anemias: Secondary | ICD-10-CM

## 2023-02-01 DIAGNOSIS — K219 Gastro-esophageal reflux disease without esophagitis: Secondary | ICD-10-CM

## 2023-02-01 MED ORDER — LISINOPRIL 10 MG TABLET
15.0000 mg | ORAL_TABLET | Freq: Every day | ORAL | 1 refills | Status: DC
Start: 2023-02-01 — End: 2023-08-07

## 2023-02-01 MED ORDER — LISINOPRIL 10 MG TABLET
10.0000 mg | ORAL_TABLET | Freq: Every day | ORAL | 0 refills | Status: DC
Start: 2023-02-01 — End: 2023-02-01

## 2023-02-01 MED ORDER — ERGOCALCIFEROL (VITAMIN D2) 1,250 MCG (50,000 UNIT) CAPSULE
50000.0000 [IU] | ORAL_CAPSULE | ORAL | 1 refills | Status: DC
Start: 2023-02-01 — End: 2023-08-07

## 2023-02-01 MED ORDER — LEVOTHYROXINE 75 MCG TABLET
75.0000 ug | ORAL_TABLET | Freq: Every morning | ORAL | 1 refills | Status: DC
Start: 2023-02-01 — End: 2023-08-07

## 2023-02-01 NOTE — Progress Notes (Signed)
Medicare wellness refused.

## 2023-02-01 NOTE — Progress Notes (Signed)
[facility]  [address]  [place] [address]       Name: Katherine [name] MRN:  [id number]   Date: 02/01/2023 Age: 72 y.o.       Chief Complaint:    Chief Complaint   Patient presents with    Follow Up 6 Months        HPI:  Katherine [name] is a 72 y.o. female who presents for follow-up.  Did not want to do medicare wellness.  To have blood work done in June for hematology.  Her back and leg are much better. Has 2 more therapy sessions.  Had blood work done yesterday.  Results pending.           Past Medical History:  Past Medical History:   Diagnosis Date    Anemia     B12 deficiency     Esophageal reflux     Essential hypertension     Hx of breast cancer     Hx of transfusion     Hypercholesterolemia     Osteoporosis     Thyroid disease     Vitamin D deficiency          Past Surgical History:   Procedure Laterality Date    HX BREAST LUMPECTOMY      HX COLONOSCOPY      HX RADICAL MASTECTOMY      HX TUBAL LIGATION        Current Outpatient Medications   Medication Sig    albuterol sulfate (PROVENTIL OR VENTOLIN OR PROAIR) 90 mcg/actuation Inhalation oral inhaler Take 1-2 Puffs by inhalation Every 6 hours as needed    cyanocobalamin (VITAMIN B12) 2,500 mcg Sublingual Tablet, Sublingual Place 1 Tablet (2,500 mcg total) under the tongue Once a day    ergocalciferol, vitamin D2, (DRISDOL) 1,250 mcg (50,000 unit) Oral Capsule Take 1 Capsule (50,000 Units total) by mouth Every 7 days    Ibuprofen (MOTRIN) 800 mg Oral Tablet TAKE 1 TABLET BY MOUTH ONCE DAILY AS NEEDED WITH FOOD    levothyroxine (SYNTHROID) 75 mcg Oral Tablet Take 1 Tablet (75 mcg total) by mouth Every morning    lisinopriL (PRINIVIL) 10 mg Oral Tablet Take 1 Tablet (10 mg total) by mouth Once a day Takes 1.5 tables  so she has been doing 15 mg.    methocarbamoL (ROBAXIN) 500 mg Oral Tablet Take 1 Tablet (500 mg total) by mouth Four times a day     Allergies   Allergen Reactions    Boniva [Ibandronate]     Lipitor  [Atorvastatin]        Family History:  Family Medical History:       Problem Relation (Age of Onset)    Blood Clots Daughter    Hypertension (High Blood Pressure) Mother    Lung Cancer Father    Thyroid Disease Mother              Social History:   Social History     Tobacco Use   Smoking Status Former    Types: Cigarettes   Smokeless Tobacco Never     Social History     Substance and Sexual Activity   Alcohol Use Never     Social History     Occupational History    Not on file       Review of Systems:  Review of systems as discussed in HPI    Problem List:  Patient Active Problem List  Diagnosis    Ductal carcinoma in situ (DCIS) of right breast    Iron deficiency anemia secondary to inadequate dietary iron intake    Paget's disease of female breast, right (CMS HCC)    Hypothyroidism    GERD (gastroesophageal reflux disease)       Physical Examination:  BP (!) 130/51 (Site: Right, Patient Position: Sitting, Cuff Size: Adult)   Pulse 76   Ht 1.6 m (5' 2.99")   Wt 61.8 kg (136 lb 3.2 oz)   SpO2 96%   BMI 24.13 kg/m       Physical Exam  Constitutional:       General: She is not in acute distress.     Appearance: Normal appearance. She is normal weight. She is not diaphoretic.   HENT:      Head: Normocephalic.      Right Ear: Tympanic membrane normal.      Left Ear: Tympanic membrane normal.      Nose: Nose normal.      Mouth/Throat:      Mouth: Mucous membranes are moist.      Pharynx: Oropharynx is clear.   Eyes:      Pupils: Pupils are equal, round, and reactive to light.   Cardiovascular:      Rate and Rhythm: Normal rate and regular rhythm.   Pulmonary:      Breath sounds: Normal breath sounds.   Abdominal:      General: Bowel sounds are normal.      Palpations: Abdomen is soft.   Musculoskeletal:         General: Normal range of motion.      Cervical back: Normal range of motion and neck supple.   Skin:     General: Skin is warm and dry.   Neurological:      Mental Status: She is alert.   Psychiatric:          Mood and Affect: Mood normal.         Behavior: Behavior normal.         Thought Content: Thought content normal.         Judgment: Judgment normal.     Health Maintenance:  Health Maintenance   Topic Date Due    Hepatitis C screening  Never done    Shingles Vaccine (1 of 2) Never done    Medicare Annual Wellness Visit  Never done    Influenza Vaccine (1) Never done    Covid-19 Vaccine (3 - 2023-24 season) 07/15/2022    Depression Screening  09/15/2023    Mammography-Unilateral  12/07/2024    Colonoscopy  11/04/2027    Adult Tdap-Td (2 - Td or Tdap) 11/14/2029    Osteoporosis screening  12/03/2031    Pneumococcal Vaccination, Age 42+  Completed    Meningococcal Vaccine  Aged Out        Assessment:    ICD-10-CM    1. Hypothyroidism, unspecified type  E03.9       2. Hypertension, unspecified type  I10       3. Iron deficiency anemia secondary to inadequate dietary iron intake  D50.8       4. Gastroesophageal reflux disease without esophagitis  K21.9            Plan:  will continue present meds.  Await results of blood work done yesterday.      Orders Placed This Encounter    lisinopriL (PRINIVIL) 10 mg Oral Tablet    levothyroxine (SYNTHROID)  75 mcg Oral Tablet    ergocalciferol, vitamin D2, (DRISDOL) 1,250 mcg (50,000 unit) Oral Capsule                    Follow up:  As scheduled.     This note was partially created using voice recognition software and is inherently subject to errors including those of syntax and "sound alike " substitutions which may escape proof reading.  In such instances, original meaning may be extrapolated by contextual derivation.    [address] [place], FNP-BC  02/01/2023, 09:01

## 2023-02-14 ENCOUNTER — Encounter (INDEPENDENT_AMBULATORY_CARE_PROVIDER_SITE_OTHER): Payer: Self-pay | Admitting: Family

## 2023-02-14 ENCOUNTER — Other Ambulatory Visit: Payer: Self-pay

## 2023-02-14 ENCOUNTER — Ambulatory Visit (INDEPENDENT_AMBULATORY_CARE_PROVIDER_SITE_OTHER): Payer: Medicare Other | Admitting: Family

## 2023-02-14 ENCOUNTER — Telehealth (INDEPENDENT_AMBULATORY_CARE_PROVIDER_SITE_OTHER): Payer: Self-pay | Admitting: Family

## 2023-02-14 VITALS — BP 122/71 | HR 76 | Wt 136.0 lb

## 2023-02-14 DIAGNOSIS — J40 Bronchitis, not specified as acute or chronic: Secondary | ICD-10-CM

## 2023-02-14 DIAGNOSIS — I10 Essential (primary) hypertension: Secondary | ICD-10-CM

## 2023-02-14 MED ORDER — LIDOCAINE HCL 10 MG/ML (1 %) INJECTION SOLUTION
1.0000 g | INTRAMUSCULAR | Status: AC
Start: 2023-02-14 — End: 2023-02-14
  Administered 2023-02-14: 1 g via INTRAMUSCULAR

## 2023-02-14 MED ORDER — PROMETHAZINE 6.25 MG/5 ML ORAL SYRUP
6.2500 mg | ORAL_SOLUTION | Freq: Four times a day (QID) | ORAL | 0 refills | Status: DC | PRN
Start: 2023-02-14 — End: 2023-02-14

## 2023-02-14 MED ORDER — PREDNISONE 20 MG TABLET
20.0000 mg | ORAL_TABLET | Freq: Every day | ORAL | 0 refills | Status: DC
Start: 2023-02-14 — End: 2023-03-16

## 2023-02-14 MED ORDER — PROMETHAZINE 6.25 MG/5 ML ORAL SYRUP
6.2500 mg | ORAL_SOLUTION | Freq: Four times a day (QID) | ORAL | 0 refills | Status: DC | PRN
Start: 2023-02-14 — End: 2023-03-16

## 2023-02-14 MED ORDER — DEXAMETHASONE SODIUM PHOSPHATE 4 MG/ML INJECTION SOLUTION
8.0000 mg | INTRAMUSCULAR | Status: AC
Start: 2023-02-14 — End: 2023-02-14
  Administered 2023-02-14: 8 mg via INTRAMUSCULAR

## 2023-02-14 MED ORDER — AMOXICILLIN 875 MG-POTASSIUM CLAVULANATE 125 MG TABLET
1.0000 | ORAL_TABLET | Freq: Two times a day (BID) | ORAL | 0 refills | Status: DC
Start: 2023-02-14 — End: 2023-03-16

## 2023-02-14 NOTE — Progress Notes (Signed)
[facility]  [address]  [place] [address]       Name: Katherine [name] MRN:  [id number]   Date: 02/14/2023 Age: 72 y.o.       Chief Complaint:    Chief Complaint   Patient presents with    Sinus Infection     Katherine [name] she is having trouble breathing today. Said she is having the drainage.  She is taking some Mucinex that is helping.         HPI:  Katherine [name] is a 72 y.o. female who presents with complaints of cough and congestion x 4 days.  No fever.  Cough productive of purulent sputum.  She is wheezing at times.  Using her inhaler and mucinex.  She did have some left over prednisone at home which she has taken.          Past Medical History:  Past Medical History:   Diagnosis Date    Anemia     B12 deficiency     Esophageal reflux     Essential hypertension     Hx of breast cancer     Hx of transfusion     Hypercholesterolemia     Osteoporosis     Thyroid disease     Vitamin D deficiency          Past Surgical History:   Procedure Laterality Date    HX BREAST LUMPECTOMY      HX COLONOSCOPY      HX RADICAL MASTECTOMY      HX TUBAL LIGATION        Current Outpatient Medications   Medication Sig    albuterol sulfate (PROVENTIL OR VENTOLIN OR PROAIR) 90 mcg/actuation Inhalation oral inhaler Take 1-2 Puffs by inhalation Every 6 hours as needed    amoxicillin-pot clavulanate (AUGMENTIN) 875-125 mg Oral Tablet Take 1 Tablet by mouth Twice daily    cyanocobalamin (VITAMIN B12) 2,500 mcg Sublingual Tablet, Sublingual Place 1 Tablet (2,500 mcg total) under the tongue Once a day    ergocalciferol, vitamin D2, (DRISDOL) 1,250 mcg (50,000 unit) Oral Capsule Take 1 Capsule (50,000 Units total) by mouth Every 7 days    Ibuprofen (MOTRIN) 800 mg Oral Tablet TAKE 1 TABLET BY MOUTH ONCE DAILY AS NEEDED WITH FOOD    levothyroxine (SYNTHROID) 75 mcg Oral Tablet Take 1 Tablet (75 mcg total) by mouth Every morning    lisinopriL (PRINIVIL) 10 mg Oral Tablet Take 1.5 Tablets (15 mg total) by  mouth Once a day Takes 1.5 tables  so she has been doing 15 mg.    methocarbamoL (ROBAXIN) 500 mg Oral Tablet Take 1 Tablet (500 mg total) by mouth Four times a day    predniSONE (DELTASONE) 20 mg Oral Tablet Take 1 Tablet (20 mg total) by mouth Once a day    promethazine (PHENERGAN) 6.25 mg/5 mL Oral Syrup Take 5 mL (6.25 mg total) by mouth Every 6 hours as needed for Nausea/Vomiting (cough)     Allergies   Allergen Reactions    Boniva [Ibandronate]     Lipitor [Atorvastatin]        Family History:  Family Medical History:       Problem Relation (Age of Onset)    Blood Clots Daughter    Hypertension (High Blood Pressure) Mother    Lung Cancer Father    Thyroid Disease Mother              Social History:  Social History     Tobacco Use   Smoking Status Former    Types: Cigarettes   Smokeless Tobacco Never     Social History     Substance and Sexual Activity   Alcohol Use Never     Social History     Occupational History    Not on file       Review of Systems:  Review of systems as discussed in HPI    Problem List:  Patient Active Problem List   Diagnosis    Ductal carcinoma in situ (DCIS) of right breast    Iron deficiency anemia secondary to inadequate dietary iron intake    Paget's disease of female breast, right (CMS HCC)    Hypothyroidism    GERD (gastroesophageal reflux disease)       Physical Examination:  BP 122/71   Pulse 76   Wt 61.7 kg (136 lb)   SpO2 95%   BMI 24.10 kg/m       Physical Exam  Constitutional:       Appearance: Normal appearance.   HENT:      Head: Normocephalic.      Nose: Congestion present.      Mouth/Throat:      Pharynx: Oropharyngeal exudate present.   Cardiovascular:      Rate and Rhythm: Normal rate and regular rhythm.   Pulmonary:      Breath sounds: Wheezing present.   Abdominal:      General: Bowel sounds are normal.   Neurological:      Mental Status: She is alert.        Health Maintenance:  Health Maintenance   Topic Date Due    Hepatitis C screening  Never done     Shingles Vaccine (1 of 2) Never done    Medicare Annual Wellness Visit  Never done    Covid-19 Vaccine (3 - 2023-24 season) 07/15/2022    Influenza Vaccine (Season Ended) 07/16/2023    Depression Screening  09/15/2023    Mammography-Unilateral  12/07/2024    Colonoscopy  11/04/2027    Adult Tdap-Td (2 - Td or Tdap) 11/14/2029    Osteoporosis screening  12/03/2031    Pneumococcal Vaccination, Age 63+  Completed    Meningococcal Vaccine  Aged Out        Assessment:    ICD-10-CM    1. Bronchitis  J40       2. Hypertension, unspecified type  I10            Plan:  Given rocephin and dexamethasone in the office.  Augmentin, prednisone and phenergna sent to the pharmacy.     Orders Placed This Encounter    cefTRIAXone (ROCEPHIN) 1 g in lidocaine 2.86 mL (tot vol) IM injection    dexAMETHasone 4 mg/mL injection    amoxicillin-pot clavulanate (AUGMENTIN) 875-125 mg Oral Tablet    predniSONE (DELTASONE) 20 mg Oral Tablet    promethazine (PHENERGAN) 6.25 mg/5 mL Oral Syrup                    Follow up:  As scheduled.     This note was partially created using voice recognition software and is inherently subject to errors including those of syntax and "sound alike " substitutions which may escape proof reading.  In such instances, original meaning may be extrapolated by contextual derivation.    [address] [place], FNP-BC  02/14/2023, 10:24

## 2023-03-16 ENCOUNTER — Ambulatory Visit (INDEPENDENT_AMBULATORY_CARE_PROVIDER_SITE_OTHER): Payer: Medicare Other | Admitting: Family

## 2023-03-16 ENCOUNTER — Other Ambulatory Visit: Payer: Self-pay

## 2023-03-16 ENCOUNTER — Other Ambulatory Visit (INDEPENDENT_AMBULATORY_CARE_PROVIDER_SITE_OTHER): Payer: Self-pay | Admitting: Family

## 2023-03-16 ENCOUNTER — Inpatient Hospital Stay
Admission: RE | Admit: 2023-03-16 | Discharge: 2023-03-16 | Disposition: A | Discharge status: Home / Self Care | Payer: Medicare Other | Source: Ambulatory Visit | Attending: Family | Admitting: Family

## 2023-03-16 ENCOUNTER — Encounter (INDEPENDENT_AMBULATORY_CARE_PROVIDER_SITE_OTHER): Payer: Self-pay | Admitting: Family

## 2023-03-16 VITALS — BP 133/72 | HR 84 | Wt 136.0 lb

## 2023-03-16 DIAGNOSIS — S22000A Wedge compression fracture of unspecified thoracic vertebra, initial encounter for closed fracture: Secondary | ICD-10-CM

## 2023-03-16 DIAGNOSIS — R0781 Pleurodynia: Secondary | ICD-10-CM | POA: Insufficient documentation

## 2023-03-16 DIAGNOSIS — M546 Pain in thoracic spine: Secondary | ICD-10-CM | POA: Insufficient documentation

## 2023-03-16 DIAGNOSIS — I10 Essential (primary) hypertension: Secondary | ICD-10-CM

## 2023-03-16 MED ORDER — KETOROLAC 60 MG/2 ML INTRAMUSCULAR SOLUTION
60.0000 mg | Freq: Once | INTRAMUSCULAR | Status: AC
Start: 2023-03-16 — End: 2023-03-16
  Administered 2023-03-16: 60 mg via INTRAMUSCULAR

## 2023-03-16 MED ORDER — DEXAMETHASONE SODIUM PHOSPHATE 4 MG/ML INJECTION SOLUTION
8.0000 mg | INTRAMUSCULAR | Status: AC
Start: 2023-03-16 — End: 2023-03-16
  Administered 2023-03-16: 8 mg via INTRAMUSCULAR

## 2023-03-16 NOTE — Progress Notes (Signed)
INTERNAL MEDICINE, [facility]  [address]  [place] [address]       Name: Katherine [name] MRN:  [id number]   Date: 03/16/2023 Age: 72 y.o.       Chief Complaint:    Chief Complaint   Patient presents with    Back Pain     States she is having right side back pain. Was seen at [facility] and they gave her Muscle relaxer's and that is not helping. Said she can't take Ibuprofen cause they cause her to bleed. They also did chest x-ray and the left lung. Stated her lungs where clear.  Said the pain now is in the right rib area.         HPI:  Katherine [name] is a 72 y.o. female who presents with complaints of right mid back pain.  The pain started on the left after she had been coughing.  Now the pain has moved to the right.  Cough has resolved.  She has been taking some prednisone which has helped.  She was seen at [facility] on the 15th.  Was given robaxin and ibuprofen.  She was unable to tolerate the ibuprofen.          Past Medical History:  Past Medical History:   Diagnosis Date    Anemia     B12 deficiency     Esophageal reflux     Essential hypertension     Hx of breast cancer     Hx of transfusion     Hypercholesterolemia     Osteoporosis     Thyroid disease     Vitamin D deficiency          Past Surgical History:   Procedure Laterality Date    HX BREAST LUMPECTOMY      HX COLONOSCOPY      HX RADICAL MASTECTOMY      HX TUBAL LIGATION        Current Outpatient Medications   Medication Sig    albuterol sulfate (PROVENTIL OR VENTOLIN OR PROAIR) 90 mcg/actuation Inhalation oral inhaler Take 1-2 Puffs by inhalation Every 6 hours as needed    cyanocobalamin (VITAMIN B12) 2,500 mcg Sublingual Tablet, Sublingual Place 1 Tablet (2,500 mcg total) under the tongue Once a day    ergocalciferol, vitamin D2, (DRISDOL) 1,250 mcg (50,000 unit) Oral Capsule Take 1 Capsule (50,000 Units total) by mouth Every 7 days    Ibuprofen (MOTRIN) 800 mg Oral Tablet TAKE 1 TABLET BY MOUTH ONCE DAILY AS NEEDED WITH  FOOD    levothyroxine (SYNTHROID) 75 mcg Oral Tablet Take 1 Tablet (75 mcg total) by mouth Every morning    lisinopriL (PRINIVIL) 10 mg Oral Tablet Take 1.5 Tablets (15 mg total) by mouth Once a day Takes 1.5 tables  so she has been doing 15 mg.    methocarbamoL (ROBAXIN) 500 mg Oral Tablet Take 1 Tablet (500 mg total) by mouth Four times a day     Allergies   Allergen Reactions    Boniva [Ibandronate]     Lipitor [Atorvastatin]        Family History:  Family Medical History:       Problem Relation (Age of Onset)    Blood Clots Daughter    Hypertension (High Blood Pressure) Mother    Lung Cancer Father    Thyroid Disease Mother              Social History:   Social History  Tobacco Use   Smoking Status Former    Types: Cigarettes   Smokeless Tobacco Never     Social History     Substance and Sexual Activity   Alcohol Use Never     Social History     Occupational History    Not on file       Review of Systems:  Review of systems as discussed in HPI    Problem List:  Patient Active Problem List   Diagnosis    Ductal carcinoma in situ (DCIS) of right breast    Iron deficiency anemia secondary to inadequate dietary iron intake    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     Appearance: Normal appearance. She is normal weight. She is not diaphoretic.   HENT:      Head: Normocephalic.      Right Ear: Tympanic membrane normal.      Left Ear: Tympanic membrane normal.      Nose: Nose normal.      Mouth/Throat:      Mouth: Mucous membranes are moist.      Pharynx: Oropharynx is clear.   Eyes:      Pupils: Pupils are equal, round, and reactive to light.   Cardiovascular:      Rate and Rhythm: Normal rate and regular rhythm.   Pulmonary:      Breath sounds: Normal breath  sounds.   Abdominal:      General: Bowel sounds are normal.      Palpations: Abdomen is soft.   Musculoskeletal:         General: Normal range of motion.      Cervical back: Normal range of motion and neck supple.   Skin:     General: Skin is warm and dry.   Neurological:      Mental Status: She is alert.   Psychiatric:         Mood and Affect: Mood normal.         Behavior: Behavior normal.         Thought Content: Thought content normal.         Judgment: Judgment normal.        Health Maintenance:  Health Maintenance   Topic Date Due    Hepatitis C screening  Never done    Shingles Vaccine (1 of 2) Never done    Medicare Annual Wellness Visit  Never done    Covid-19 Vaccine (3 - 2023-24 season) 07/15/2022    Influenza Vaccine (Season Ended) 07/16/2023    Depression Screening  09/15/2023    Mammography-Unilateral  12/07/2024    Colonoscopy  11/04/2027    Adult Tdap-Td (2 - Td or Tdap) 11/14/2029    Osteoporosis screening  12/03/2031    Pneumococcal Vaccination, Age 61+  Completed    Meningococcal Vaccine  Aged Out        Assessment:    ICD-10-CM    1. Thoracic back pain  M54.6 XR THORACIC SPINE 3 VIEWS      2. Rib pain  R07.81 XR RIBS BILATERAL W PA/AP CHEST  3. Hypertension, unspecified type  I10            Plan:  She will take prednisone daily for the next 3 days.  Robaxin as tid.  Obtain xrays.  Given a dexamethasone and Toradol shot today.      Orders Placed This Encounter    XR RIBS BILATERAL W PA/AP CHEST    XR THORACIC SPINE 3 VIEWS                    Follow up:  As scheduled.     This note was partially created using voice recognition software and is inherently subject to errors including those of syntax and "sound alike " substitutions which may escape proof reading.  In such instances, original meaning may be extrapolated by contextual derivation.    [address] [place], FNP-BC  03/16/2023, 13:49

## 2023-03-20 IMAGING — MR MRI THORACIC SPINE WITHOUT CONTRAST
4 of 6 series · 28 of 48 positions shown · non-contrast
Comparison: Lumbar spine x-ray dated 12/05/2022.  MRI lumbar spine 10/10/2022.

﻿EXAM:  [id number]   MRI THORACIC SPINE WITHOUT CONTRAST
INDICATION: 71-year-old female with mid back pain suspected to have fracture.  Prior history of lumbar vertebral fracture.  No specific history of trauma.
TECHNIQUE: Coronal, sagittal and axial images of thoracic spine were obtained without contrast.

[Series 5: T2 · sagittal · 4.0mm · 0.78mm/px · 7 of 13 slices shown (1 of 3)]
[im 1/13]
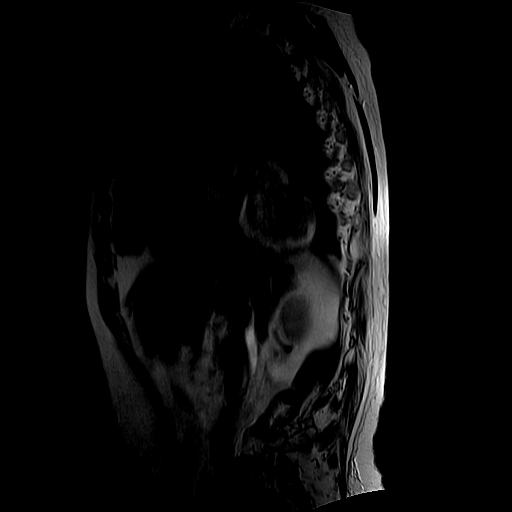
[im 3/13]
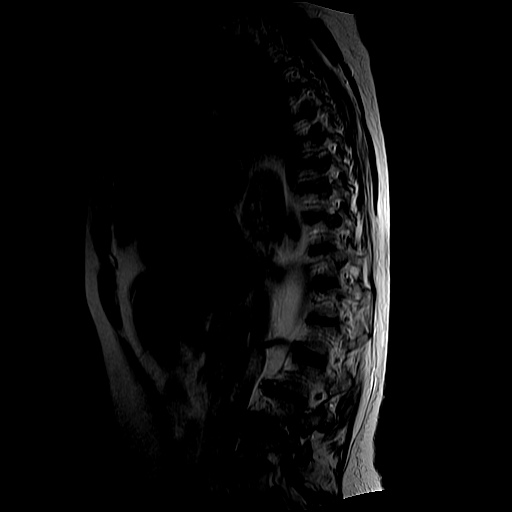
[im 5/13]
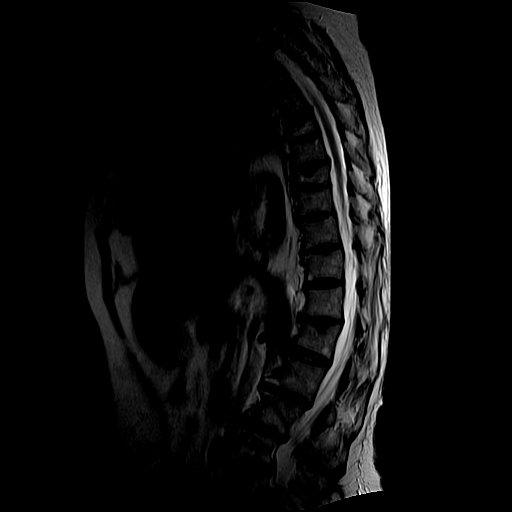
[im 7/13]
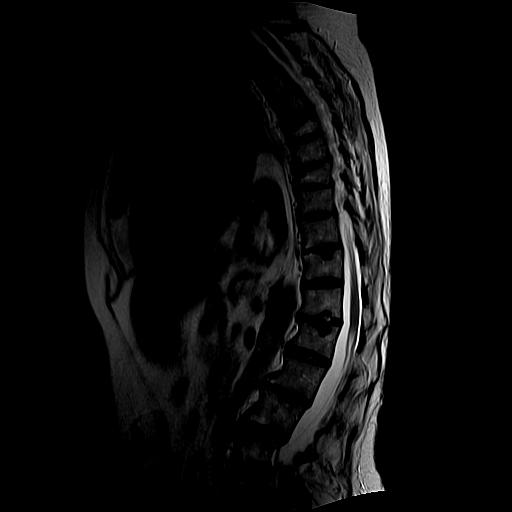
[im 9/13]
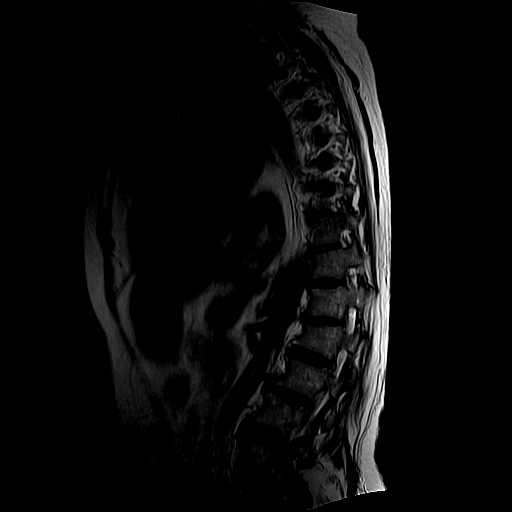
[im 11/13]
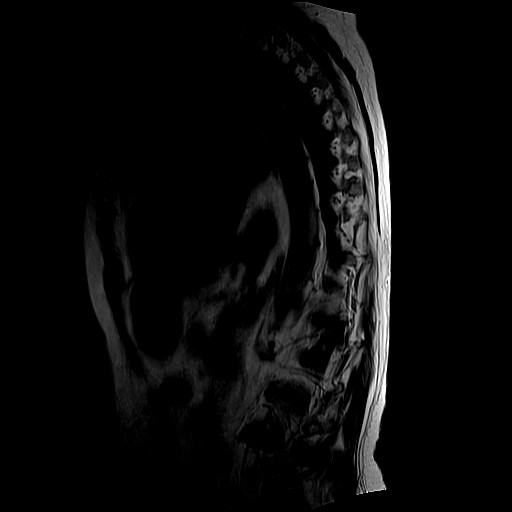
[im 13/13]
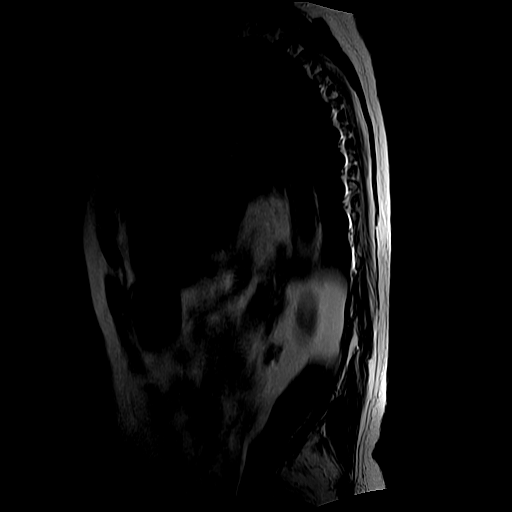

[Series 6: T1 · sagittal · 4.0mm · 0.78mm/px · 5 of 13 slices shown]
[im 1/13]
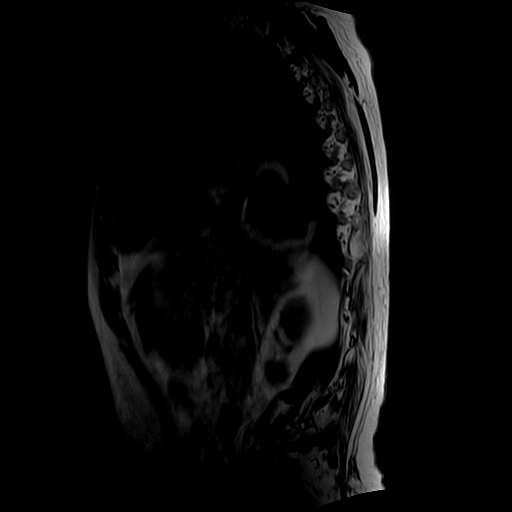
[im 3/13]
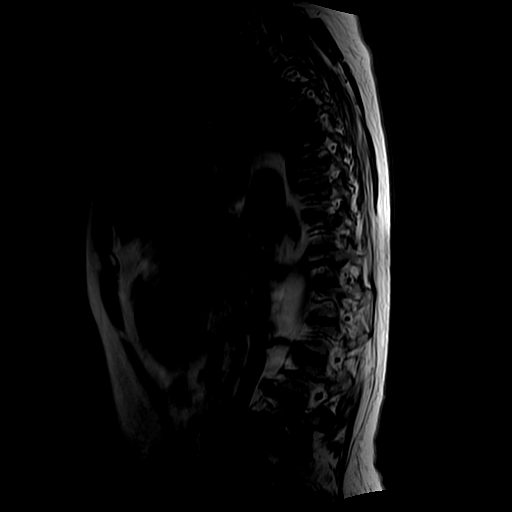
[im 5/13]
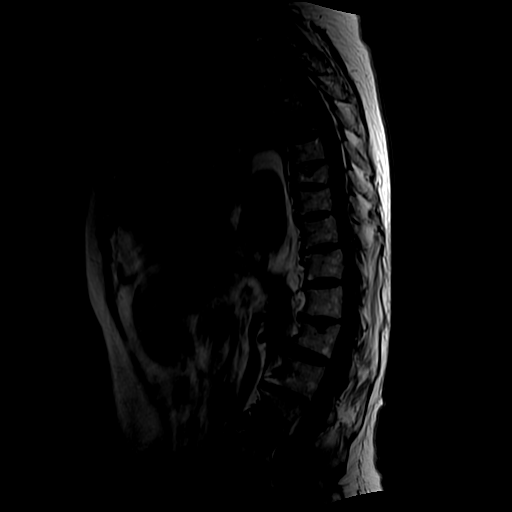
[im 7/13]
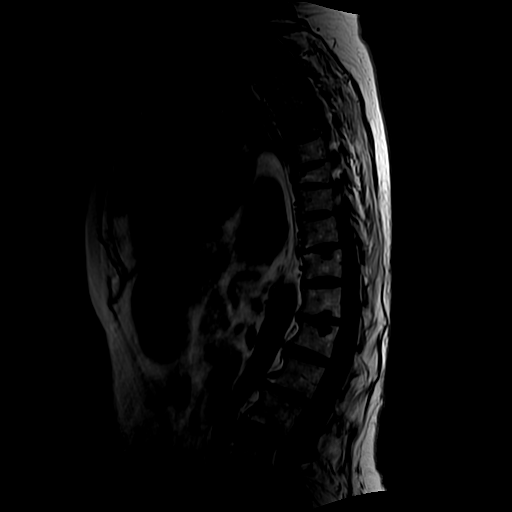
[im 11/13]
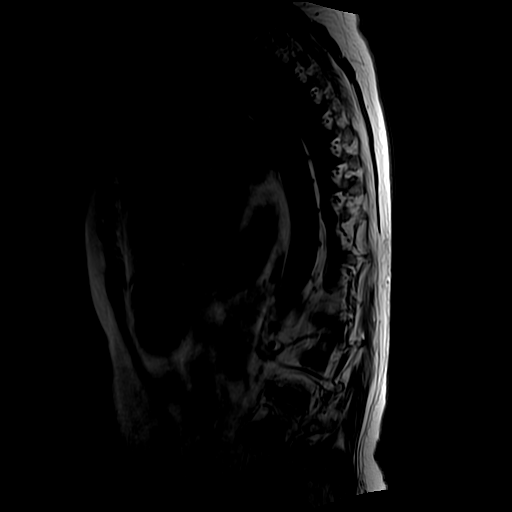

[Series 8: T2 · axial · 4.0mm · 0.62mm/px · z∈[-189,-105]mm · 8 of 18 slices shown (2 of 3)]
[im 1/18]
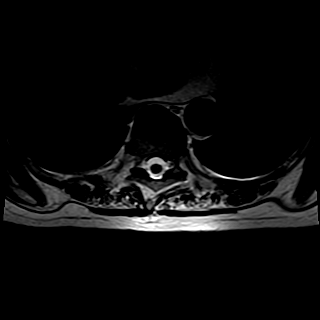
[im 2/18]
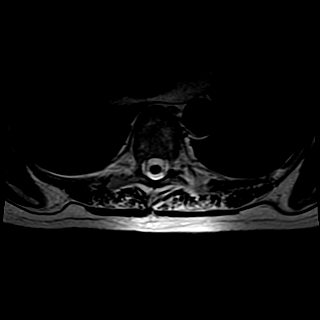
[im 6/18]
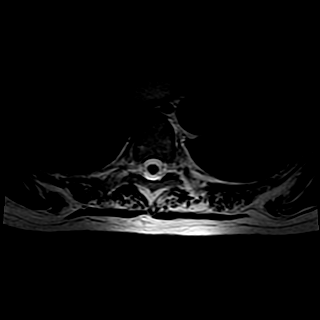
[im 8/18]
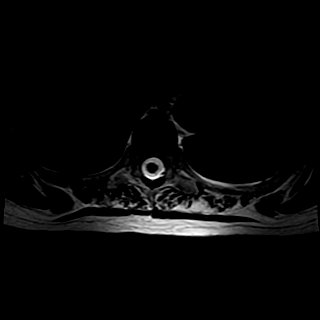
[im 10/18]
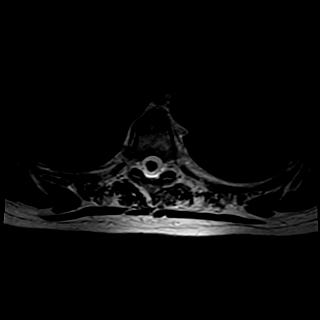
[im 12/18]
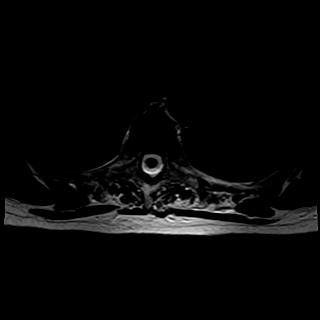
[im 16/18]
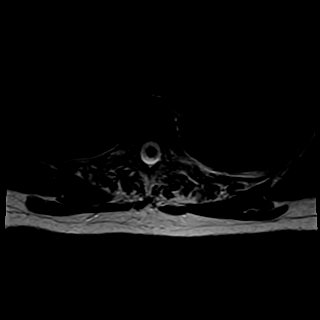
[im 18/18]
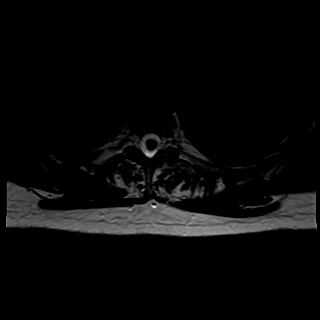

[Series 9: T2 · axial · 4.0mm · 0.62mm/px · z∈[-282,-120]mm · 8 of 13 slices shown (3 of 3)]
[im 1/13]
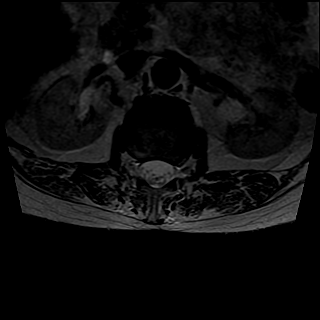
[im 2/13]
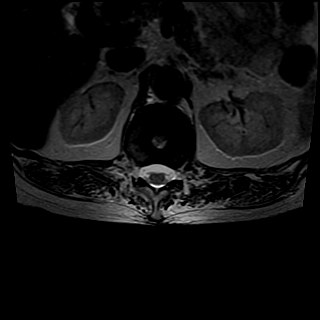
[im 4/13]
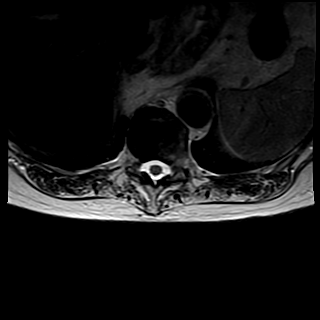
[im 6/13]
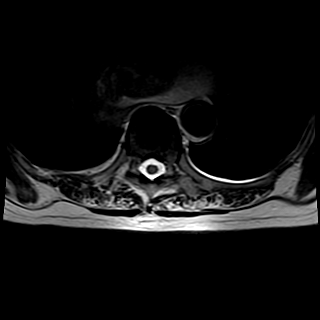
[im 7/13]
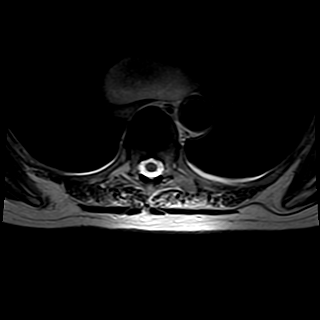
[im 9/13]
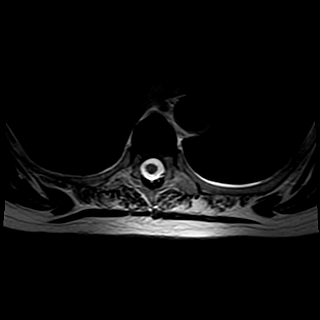
[im 11/13]
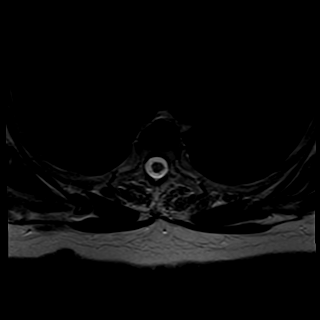
[im 13/13]
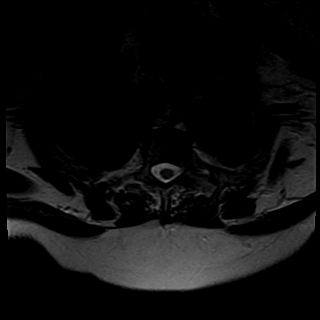

[28 of 48 positions shown; findings below may reference images not displayed]

FINDINGS: Acute or subacute fracture of upper endplate of T6 thoracic vertebra is noted with bone marrow edema.  Old compression fracture of T8 thoracic vertebra is noted.  

Mild bulging annulus is noted at T7-T8 level with mild compromise of thecal sac in the midline.  No other focal areas of compromise of thecal sac or neural foramina are seen.  Thoracic spinal cord shows no focal lesions.  

Paravertebral soft tissues show no acute findings.  Large hiatus hernia containing a large portion of stomach is noted in the lower mediastinum.
IMPRESSION: 1. Acute or subacute compression fracture of upper endplate of T6 thoracic vertebral body is noted likely osteoporotic compression fracture.  Please correlate with DEXA densitometry. 

2. Old compression fracture of T8 thoracic vertebral body.  Bulging annulus in the midline at T7-T8 level mildly compromising thecal sac in the midline. 

3. Large hiatus hernia containing significant portion of the stomach in the lower mediastinum.

## 2023-03-21 ENCOUNTER — Encounter (INDEPENDENT_AMBULATORY_CARE_PROVIDER_SITE_OTHER): Payer: Self-pay | Admitting: Family

## 2023-03-21 DIAGNOSIS — S22000A Wedge compression fracture of unspecified thoracic vertebra, initial encounter for closed fracture: Secondary | ICD-10-CM

## 2023-03-21 DIAGNOSIS — M546 Pain in thoracic spine: Secondary | ICD-10-CM

## 2023-03-24 ENCOUNTER — Telehealth (INDEPENDENT_AMBULATORY_CARE_PROVIDER_SITE_OTHER): Payer: Self-pay | Admitting: Family

## 2023-03-24 NOTE — Telephone Encounter (Signed)
PT NOTIFIED OF PRE-PROCEDURE INTERVIEW AT [facility], DOES NOT HAVE TO REGISTER, JUST ARRIVE AT OUT-PATIENT TESTING AND LET THEM KNOW WHAT She IS THERE FOR - TO SEE LANA WITH DR. Verdell Face OFFICE TO DISCUSS KYPHOPLASTY  MELISSA W

## 2023-03-27 ENCOUNTER — Telehealth (INDEPENDENT_AMBULATORY_CARE_PROVIDER_SITE_OTHER): Payer: Self-pay | Admitting: Family

## 2023-03-27 NOTE — Telephone Encounter (Signed)
PATIENT HAD A PRE-INTERVIEW WITH [facility] OFFICE TODAY, AND HER BACK IS BETTER, KYPHOPLASTY IS NOT NEEDED PER LANA.   THANKS MELISSA W

## 2023-04-03 ENCOUNTER — Encounter (HOSPITAL_COMMUNITY): Payer: Self-pay | Admitting: HEMATOLOGY-ONCOLOGY

## 2023-04-05 ENCOUNTER — Other Ambulatory Visit (INDEPENDENT_AMBULATORY_CARE_PROVIDER_SITE_OTHER): Payer: Self-pay | Admitting: Family

## 2023-04-05 MED ORDER — MONTELUKAST 10 MG TABLET
10.0000 mg | ORAL_TABLET | Freq: Every evening | ORAL | 2 refills | Status: DC
Start: 2023-04-05 — End: 2023-08-07

## 2023-04-20 ENCOUNTER — Telehealth (INDEPENDENT_AMBULATORY_CARE_PROVIDER_SITE_OTHER): Payer: Self-pay | Admitting: Family

## 2023-04-20 MED ORDER — METHYLPREDNISOLONE 4 MG TABLETS IN A DOSE PACK
ORAL_TABLET | ORAL | 0 refills | Status: DC
Start: 2023-04-20 — End: 2023-05-09

## 2023-04-20 MED ORDER — AMOXICILLIN 875 MG-POTASSIUM CLAVULANATE 125 MG TABLET
1.0000 | ORAL_TABLET | Freq: Two times a day (BID) | ORAL | 0 refills | Status: DC
Start: 2023-04-20 — End: 2023-05-09

## 2023-05-02 ENCOUNTER — Encounter (HOSPITAL_COMMUNITY): Payer: Self-pay

## 2023-05-02 NOTE — Nursing Note (Signed)
Called pt discuss her smoking history, states she quit smoking in the 80's; discussed that she does not qualify to be part of screening program, has to be 15 years or less; she voiced understanding and knows not to come to appt tomorrow. Also discussed if she feels any lung issues need addressed, please talk to her provider on her next visit; voiced understanding.

## 2023-05-03 ENCOUNTER — Ambulatory Visit: Payer: Medicare Other

## 2023-05-04 ENCOUNTER — Telehealth (INDEPENDENT_AMBULATORY_CARE_PROVIDER_SITE_OTHER): Payer: Self-pay | Admitting: NURSE PRACTITIONER

## 2023-05-04 NOTE — Cancer Center Note (Signed)
Katherine [name]  [id number]  11/19/1950   05/09/2023       Department of Hematology/Oncology  Return Patient Visit           REFERRING PROVIDER:  Henderson Baltimore, FNP-BC  [address]  [place],  [place] [address]      REASON FOR OFFICE VISIT:  Ongoing management and evaluation of  Right DCIS and iron deficiency anemia.      HISTORY OF PRESENT ILLNESS:  Katherine [name] is a 72 y.o. female who presents to today alone for evaluation of DCIS and Iron deficiency.   Patient reports that she is feeling better after her infusion.  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   06/1998  Right breast wide marginal incision with questionable focus of residual DCIS  02/23/2000 right breast mastectomy    Treatment History:  Tamoxifen 10 mg PO BID for 5 years.   Radiation therapy 5040 cGy in 28 fractions over six weeks.     REVIEW OF SYSTEMS:  Review of Systems   Constitutional:  Negative for chills, fatigue and fever.        Denies PICA   HENT:  Negative.     Eyes: Negative.    Respiratory:  Negative for cough and shortness of breath.    Cardiovascular: Negative.  Negative for chest pain.   Gastrointestinal: Negative.  Negative for blood in stool, diarrhea, nausea and vomiting.   Endocrine: Negative.    Genitourinary: Negative.  Negative for difficulty urinating.    Musculoskeletal: Negative.  Negative for arthralgias and back pain.   Skin: Negative.    Neurological: Negative.  Negative for dizziness and headaches.   Hematological: Negative.  Negative for adenopathy. Does not bruise/bleed easily.   Psychiatric/Behavioral: Negative.          Past Medical History:   Diagnosis Date    Anemia     B12 deficiency     Esophageal reflux     Essential hypertension     Hx of breast cancer     Hx of transfusion     Hypercholesterolemia     Osteoporosis     Thyroid disease      Vitamin D deficiency            Past Surgical History:   Procedure Laterality Date    HX BREAST LUMPECTOMY      HX COLONOSCOPY      HX RADICAL MASTECTOMY      HX TUBAL LIGATION             Social History     Socioeconomic History    Marital status: Married     Spouse name: Not on file    Number of children: Not on file    Years of education: Not on file    Highest education level: Not on file   Occupational History    Not on file   Tobacco Use    Smoking status: Former     Types: Cigarettes    Smokeless tobacco: Never   Vaping Use    Vaping status: Never Used   Substance and Sexual Activity    Alcohol use: Never    Drug use: Never    Sexual activity: Not on file   Other Topics Concern    Not on file  Social History Narrative    Not on file     Social Determinants of Health     Financial Resource Strain: Low Risk  (09/27/2022)    Financial Resource Strain     SDOH Financial: No   Transportation Needs: Low Risk  (09/27/2022)    Transportation Needs     SDOH Transportation: No   Social Connections: Low Risk  (09/27/2022)    Social Connections     SDOH Social Isolation: 5 or more times a week   Intimate Partner Violence: High Risk (09/27/2022)    Intimate Partner Violence     SDOH Domestic Violence: No   Housing Stability: Low Risk  (09/27/2022)    Housing Stability     SDOH Housing Situation: I have housing.     SDOH Housing Worry: No       Social History     Social History Narrative    Not on file       Social History     Substance and Sexual Activity   Drug Use Never       Family Medical History:       Problem Relation (Age of Onset)    Blood Clots Daughter    Hypertension (High Blood Pressure) Mother    Lung Cancer Father    Thyroid Disease Mother              Current Outpatient Medications   Medication Sig    albuterol sulfate (PROVENTIL OR VENTOLIN OR PROAIR) 90 mcg/actuation Inhalation oral inhaler Take 1-2 Puffs by inhalation Every 6 hours as needed    amoxicillin-pot clavulanate (AUGMENTIN) 875-125 mg Oral  Tablet Take 1 Tablet by mouth Twice daily    cyanocobalamin (VITAMIN B12) 2,500 mcg Sublingual Tablet, Sublingual Place 1 Tablet (2,500 mcg total) under the tongue Once a day    ergocalciferol, vitamin D2, (DRISDOL) 1,250 mcg (50,000 unit) Oral Capsule Take 1 Capsule (50,000 Units total) by mouth Every 7 days    levothyroxine (SYNTHROID) 75 mcg Oral Tablet Take 1 Tablet (75 mcg total) by mouth Every morning    lisinopriL (PRINIVIL) 10 mg Oral Tablet Take 1.5 Tablets (15 mg total) by mouth Once a day Takes 1.5 tables  so she has been doing 15 mg.    methocarbamoL (ROBAXIN) 500 mg Oral Tablet Take 1 Tablet (500 mg total) by mouth Four times a day    Methylprednisolone (MEDROL DOSEPACK) 4 mg Oral Tablets, Dose Pack Take as instructed.    montelukast (SINGULAIR) 10 mg Oral Tablet Take 1 Tablet (10 mg total) by mouth Every evening       Allergies   Allergen Reactions    Boniva [Ibandronate]     Lipitor [Atorvastatin]          PHYSICAL EXAM:  There were no vitals taken for this visit.       ECOG Status: (0) Fully active, able to carry on all predisease performance without restriction   Physical Exam  Vitals reviewed.   Constitutional:       Appearance: Normal appearance. She is normal weight.   HENT:      Head: Normocephalic.   Eyes:      Conjunctiva/sclera: Conjunctivae normal.      Pupils: Pupils are equal, round, and reactive to light.   Neck:      Thyroid: No thyroid mass, thyromegaly or thyroid tenderness.   Cardiovascular:      Rate and Rhythm: Normal rate and regular rhythm.  Pulses: Normal pulses.      Heart sounds: Normal heart sounds, S1 normal and S2 normal. No murmur heard.     No S3 or S4 sounds.   Pulmonary:      Effort: Pulmonary effort is normal.      Breath sounds: Normal breath sounds.   Chest:      Comments: Patient declines breast exam    Abdominal:      General: Bowel sounds are normal.      Palpations: Abdomen is soft.   Musculoskeletal:         General: Normal range of motion.      Cervical  back: Normal range of motion and neck supple.      Right lower leg: No edema.      Left lower leg: No edema.   Lymphadenopathy:      Head:      Right side of head: No submental, submandibular, posterior auricular or occipital adenopathy.      Left side of head: No submental, submandibular, posterior auricular or occipital adenopathy.      Cervical: No cervical adenopathy.      Right cervical: No superficial, deep or posterior cervical adenopathy.     Left cervical: No superficial, deep or posterior cervical adenopathy.      Upper Body:      Right upper body: No supraclavicular or axillary adenopathy.      Left upper body: No supraclavicular or axillary adenopathy.      Lower Body: No right inguinal adenopathy. No left inguinal adenopathy.   Skin:     General: Skin is warm and dry.      Capillary Refill: Capillary refill takes less than 2 seconds.   Neurological:      General: No focal deficit present.      Mental Status: She is alert. Mental status is at baseline.      Sensory: Sensation is intact.      Motor: Motor function is intact.      Coordination: Coordination is intact.      Gait: Gait is intact.   Psychiatric:         Attention and Perception: Attention and perception normal.         Mood and Affect: Mood and affect normal.         Speech: Speech normal.         Behavior: Behavior normal. Behavior is cooperative.         Thought Content: Thought content normal.         Cognition and Memory: Cognition and memory normal.         Judgment: Judgment normal.         Radiology:   Mammogram 2023 at [facility]    LABS:         CBC  Diff   Lab Results   Component Value Date/Time    WBC 6.1 01/23/2023 12:08 PM    HGB 14.4 (H) 01/23/2023 12:08 PM    HCT 43.2 (H) 01/23/2023 12:08 PM    PLTCNT 302 01/23/2023 12:08 PM    RBC 5.26 (H) 01/23/2023 12:08 PM    MCV 82.0 01/23/2023 12:08 PM    MCHC 33.3 01/23/2023 12:08 PM    MCH 27.3 01/23/2023 12:08 PM    RDW 22.3 (H) 01/23/2023 12:08 PM    MPV 8.9 01/23/2023  12:08 PM    Lab Results   Component Value Date/Time    PMNS 63 01/23/2023 12:08 PM  LYMPHOCYTES 25 01/23/2023 12:08 PM    EOSINOPHIL 3 01/23/2023 12:08 PM    MONOCYTES 9 01/23/2023 12:08 PM    BASOPHILS 1 01/23/2023 12:08 PM    BASOPHILS 0.10 01/23/2023 12:08 PM    PMNABS 3.80 01/23/2023 12:08 PM    LYMPHSABS 1.50 01/23/2023 12:08 PM    EOSABS 0.20 01/23/2023 12:08 PM    MONOSABS 0.50 01/23/2023 12:08 PM            Comprehensive Metabolic Profile    Lab Results   Component Value Date    SODIUM 141 01/23/2023    POTASSIUM 4.5 01/23/2023    CHLORIDE 108 (H) 01/23/2023    CO2 27 01/23/2023    ANIONGAP 6 01/23/2023    BUN 10 01/23/2023    CREATININE 0.92 01/23/2023    ALBUMIN 4.3 01/23/2023    CALCIUM 10.2 01/23/2023    GLUCOSENF 86 01/23/2023    ALKPHOS 53 01/23/2023    ALT 12 01/23/2023    AST 17 01/23/2023    TOTBILIRUBIN 0.4 01/23/2023    TOTALPROTEIN 6.7 01/23/2023              IRON   Date Value Ref Range Status   01/23/2023 152 50 - 212 ug/dL Final     FERRITIN   Date Value Ref Range Status   01/23/2023 228 11 - 336 ng/mL Final     TOTAL IRON BINDING CAPACITY   Date Value Ref Range Status   01/23/2023 335 250 - 450 ug/dL Final     UIBC   Date Value Ref Range Status   01/23/2023 183 130 - 375 ug/dL Final     IRON (TRANSFERRIN) SATURATION   Date Value Ref Range Status   01/23/2023 45 15 - 50 % Final                  ASSESSMENT:    ICD-10-CM    1. Iron deficiency anemia secondary to inadequate dietary iron intake  D50.8       2. Anemia, unspecified type  D64.9                  PLAN:   1. Iron-deficiency anemia:  replaced at this time.  Asymptomatic at this time.   I will recheck her labs in a couple of weeks.   2.  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  She continues to have yearly mammograms as scheduled.  3. Former smoker of approximately 20 pack years.  I did discuss with the patient low-dose CT scanning.   Patient did not quailify under the guidelines at  that time due to  quitting smoking in the 1980's.   Guidelines recently changed and the <15 year requirement has been removed.   I have ordered the low dose CT scan at this time.   No follow-ups on file.     KRISTE BROMAN was given the chance to ask questions, and these were answered to their satisfaction. The patient is welcome to call with any questions or concerns in the meantime.     On the day of the encounter, I spent a total of 33 minutes on this patient encounter including review of historical information, examination, documentation and post-visit activities.     Marvene Staff APRN, FNP-BC, AOCNP, 05/04/2023 , 14:31     You can see your note(s) in [facility]. It is common for you to encounter certain medical terminology which may  be unfamiliar to you. You might see results before your provider does so please give at least 2 business days for review. Please have this understanding, that NOT all abnormal results are significant. Our office will contact you for any urgent or emergent action if necessary. If you have any questions or concerns, feel free to send a MyChart message or call the office. Please call with any new or concerning symptoms.       This note was partially generated using MModal Fluency Direct system, and there may be some incorrect words, spellings, and punctuation that were not noted in checking the note before saving.     CC:  [employer], FNP-BC  [address]  [place] [address]

## 2023-05-04 NOTE — Telephone Encounter (Signed)
Spoke to pt regarding change in appt time on 06-08-23 @ 10:30 due to a change in provider's schedule

## 2023-05-09 ENCOUNTER — Inpatient Hospital Stay (INDEPENDENT_AMBULATORY_CARE_PROVIDER_SITE_OTHER)
Admission: RE | Admit: 2023-05-09 | Discharge: 2023-05-09 | Disposition: A | Discharge status: Home / Self Care | Payer: Medicare Other | Source: Ambulatory Visit | Attending: NURSE PRACTITIONER

## 2023-05-09 ENCOUNTER — Encounter (INDEPENDENT_AMBULATORY_CARE_PROVIDER_SITE_OTHER): Payer: Self-pay | Admitting: NURSE PRACTITIONER

## 2023-05-09 ENCOUNTER — Other Ambulatory Visit: Payer: Self-pay

## 2023-05-09 ENCOUNTER — Ambulatory Visit: Payer: Medicare Other | Attending: NURSE PRACTITIONER | Admitting: NURSE PRACTITIONER

## 2023-05-09 VITALS — BP 135/67 | HR 76 | Temp 98.6°F | Ht 63.0 in | Wt 134.6 lb

## 2023-05-09 DIAGNOSIS — C50011 Malignant neoplasm of nipple and areola, right female breast: Secondary | ICD-10-CM

## 2023-05-09 DIAGNOSIS — Z09 Encounter for follow-up examination after completed treatment for conditions other than malignant neoplasm: Secondary | ICD-10-CM | POA: Insufficient documentation

## 2023-05-09 DIAGNOSIS — D508 Other iron deficiency anemias: Secondary | ICD-10-CM | POA: Insufficient documentation

## 2023-05-09 DIAGNOSIS — Z86000 Personal history of in-situ neoplasm of breast: Secondary | ICD-10-CM | POA: Insufficient documentation

## 2023-05-09 DIAGNOSIS — D649 Anemia, unspecified: Secondary | ICD-10-CM

## 2023-05-09 DIAGNOSIS — Z87891 Personal history of nicotine dependence: Secondary | ICD-10-CM | POA: Insufficient documentation

## 2023-05-09 LAB — COMPREHENSIVE METABOLIC PANEL, NON-FASTING
ALBUMIN/GLOBULIN RATIO: 1.7 — ABNORMAL HIGH (ref 0.8–1.4)
ALBUMIN: 4.5 g/dL (ref 3.5–5.7)
ALKALINE PHOSPHATASE: 45 U/L (ref 34–104)
ALT (SGPT): 13 U/L (ref 7–52)
ANION GAP: 9 mmol/L (ref 4–13)
AST (SGOT): 18 U/L (ref 13–39)
BILIRUBIN TOTAL: 0.4 mg/dL (ref 0.3–1.2)
BUN/CREA RATIO: 13 (ref 6–22)
BUN: 11 mg/dL (ref 7–25)
CALCIUM, CORRECTED: 9.4 mg/dL (ref 8.9–10.8)
CALCIUM: 9.8 mg/dL (ref 8.6–10.3)
CHLORIDE: 109 mmol/L — ABNORMAL HIGH (ref 98–107)
CO2 TOTAL: 23 mmol/L (ref 21–31)
CREATININE: 0.87 mg/dL (ref 0.60–1.30)
ESTIMATED GFR: 71 mL/min/{1.73_m2} (ref >59)
GLOBULIN: 2.7 — ABNORMAL LOW (ref 2.9–5.4)
GLUCOSE: 79 mg/dL (ref 74–109)
OSMOLALITY, CALCULATED: 280 mOsm/kg (ref 270–290)
POTASSIUM: 3.7 mmol/L (ref 3.5–5.1)
PROTEIN TOTAL: 7.2 g/dL (ref 6.4–8.9)
SODIUM: 141 mmol/L (ref 136–145)

## 2023-05-09 LAB — CBC WITH DIFF
BASOPHIL #: 0.1 10*3/uL (ref 0.00–0.10)
BASOPHIL %: 1 % (ref 0–1)
EOSINOPHIL #: 0.2 10*3/uL (ref 0.00–0.50)
EOSINOPHIL %: 4 %
HCT: 40 % (ref 31.2–41.9)
HGB: 13.4 g/dL (ref 10.9–14.3)
LYMPHOCYTE #: 1.4 10*3/uL (ref 1.00–3.00)
LYMPHOCYTE %: 23 % (ref 16–44)
MCH: 28.6 pg (ref 24.7–32.8)
MCHC: 33.5 g/dL (ref 32.3–35.6)
MCV: 85.2 fL (ref 75.5–95.3)
MONOCYTE #: 0.6 10*3/uL (ref 0.30–1.00)
MONOCYTE %: 11 % (ref 5–13)
MPV: 8.4 fL (ref 7.9–10.8)
NEUTROPHIL #: 3.8 10*3/uL (ref 1.85–7.80)
NEUTROPHIL %: 62 % (ref 43–77)
PLATELETS: 345 10*3/uL (ref 140–440)
RBC: 4.7 10*6/uL (ref 3.63–4.92)
RDW: 13.2 % (ref 12.3–17.7)
WBC: 6.1 10*3/uL (ref 3.8–11.8)

## 2023-05-09 LAB — FERRITIN: FERRITIN: 16 ng/mL (ref 11–336)

## 2023-05-09 LAB — IRON TRANSFERRIN AND TIBC
IRON (TRANSFERRIN) SATURATION: 9 % — ABNORMAL LOW (ref 15–50)
IRON: 42 ug/dL — ABNORMAL LOW (ref 50–212)
TOTAL IRON BINDING CAPACITY: 458 ug/dL — ABNORMAL HIGH (ref 250–450)
TRANSFERRIN: 327 mg/dL (ref 203–362)
UIBC: 416 ug/dL — ABNORMAL HIGH (ref 130–375)

## 2023-05-09 LAB — RETICULOCYTE COUNT
IMMATURE RETIC FRACTION: 0.33 (ref 0.29–0.53)
RETICULOCYTE % AUTOMATED: 1.77 % (ref 0.5–2.17)
RETICULOCYTES COUNT # AUTOMATED: 0.0831 10*6/uL (ref 0.0221–0.0963)

## 2023-05-17 ENCOUNTER — Ambulatory Visit
Admission: RE | Admit: 2023-05-17 | Discharge: 2023-05-17 | Disposition: A | Discharge status: Home / Self Care | Payer: Medicare Other | Source: Ambulatory Visit | Attending: NURSE PRACTITIONER | Admitting: NURSE PRACTITIONER

## 2023-05-17 ENCOUNTER — Other Ambulatory Visit: Payer: Self-pay

## 2023-05-17 VITALS — BP 109/52 | HR 90 | Temp 97.5°F | Resp 20

## 2023-05-17 DIAGNOSIS — D508 Other iron deficiency anemias: Secondary | ICD-10-CM | POA: Insufficient documentation

## 2023-05-17 MED ORDER — PROCHLORPERAZINE MALEATE 10 MG TABLET
10.0000 mg | ORAL_TABLET | Freq: Once | ORAL | Status: DC | PRN
Start: 2023-05-17 — End: 2023-05-18

## 2023-05-17 MED ORDER — EPINEPHRINE 1 MG/ML (1 ML) INJECTION SOLUTION
0.3000 mg | Freq: Once | INTRAMUSCULAR | Status: DC | PRN
Start: 2023-05-17 — End: 2023-05-18

## 2023-05-17 MED ORDER — FAMOTIDINE (PF) 20 MG/2 ML INTRAVENOUS SOLUTION
20.0000 mg | Freq: Once | INTRAVENOUS | Status: DC | PRN
Start: 2023-05-17 — End: 2023-05-18

## 2023-05-17 MED ORDER — ACETAMINOPHEN 325 MG TABLET
975.0000 mg | ORAL_TABLET | Freq: Once | ORAL | Status: DC | PRN
Start: 2023-05-17 — End: 2023-05-18

## 2023-05-17 MED ORDER — ALBUTEROL SULFATE 2.5 MG/3 ML (0.083 %) SOLUTION FOR NEBULIZATION
2.5000 mg | INHALATION_SOLUTION | Freq: Once | RESPIRATORY_TRACT | Status: DC | PRN
Start: 2023-05-17 — End: 2023-05-18

## 2023-05-17 MED ORDER — LORATADINE 10 MG TABLET
10.0000 mg | ORAL_TABLET | Freq: Once | ORAL | Status: DC | PRN
Start: 2023-05-17 — End: 2023-05-18

## 2023-05-17 MED ORDER — ALBUTEROL SULFATE HFA 90 MCG/ACTUATION AEROSOL INHALER - RN
2.0000 | Freq: Once | RESPIRATORY_TRACT | Status: DC | PRN
Start: 2023-05-17 — End: 2023-05-18

## 2023-05-17 MED ORDER — SODIUM CHLORIDE 0.9 % INTRAVENOUS SOLUTION
750.0000 mg | Freq: Once | INTRAVENOUS | Status: AC
Start: 2023-05-17 — End: 2023-05-17
  Administered 2023-05-17: 750 mg via INTRAVENOUS
  Administered 2023-05-17: 0 mg via INTRAVENOUS
  Filled 2023-05-17: qty 15

## 2023-05-17 MED ORDER — SODIUM CHLORIDE 0.9 % IV BOLUS
500.0000 mL | INJECTION | Freq: Once | Status: DC | PRN
Start: 2023-05-17 — End: 2023-05-18

## 2023-05-17 MED ORDER — HYDROCORTISONE SOD SUCCINATE 100 MG/2 ML VIAL WRAPPER
100.0000 mg | Freq: Once | INTRAMUSCULAR | Status: DC | PRN
Start: 2023-05-17 — End: 2023-05-18

## 2023-05-17 NOTE — Nurses Notes (Signed)
1255 - Patient ambulatory to room for Injectafer infusion. Assessment complete. Lungs clear. Abdomen soft and non-tender, bowel sounds present. No edema noted. Patient denies any pain or discomfort. Patient states she received Injectafer infusion earlier this year and tolerated well. Carolynn Serve, RN  [phone] - Orders released to pharmacy. Carolynn Serve, RN  [phone] - Peripheral IV started to left wrist, patient tolerated well. Carolynn Serve, RN  [phone] - Injectafer infusion started. Carolynn Serve, RN  [phone] - Injectafer infusion complete. Line flushing. Carolynn Serve, RN  [phone] - Peripheral IV removed. Gauze dressing and coban applied. Patient left unit via ambulation at this time. Carolynn Serve, RN

## 2023-05-24 ENCOUNTER — Other Ambulatory Visit: Payer: Self-pay

## 2023-05-24 ENCOUNTER — Ambulatory Visit
Admission: RE | Admit: 2023-05-24 | Discharge: 2023-05-24 | Disposition: A | Discharge status: Home / Self Care | Payer: Medicare Other | Source: Ambulatory Visit | Attending: NURSE PRACTITIONER | Admitting: NURSE PRACTITIONER

## 2023-05-24 VITALS — BP 121/64 | HR 84 | Temp 98.0°F | Resp 20

## 2023-05-24 DIAGNOSIS — D508 Other iron deficiency anemias: Secondary | ICD-10-CM | POA: Insufficient documentation

## 2023-05-24 MED ORDER — LORATADINE 10 MG TABLET
10.0000 mg | ORAL_TABLET | Freq: Once | ORAL | Status: DC | PRN
Start: 2023-05-24 — End: 2023-05-25

## 2023-05-24 MED ORDER — HYDROCORTISONE SOD SUCCINATE 100 MG/2 ML VIAL WRAPPER
100.0000 mg | Freq: Once | INTRAMUSCULAR | Status: DC | PRN
Start: 2023-05-24 — End: 2023-05-25

## 2023-05-24 MED ORDER — SODIUM CHLORIDE 0.9 % INTRAVENOUS SOLUTION
750.0000 mg | Freq: Once | INTRAVENOUS | Status: AC
Start: 2023-05-24 — End: 2023-05-24
  Administered 2023-05-24: 0 mg via INTRAVENOUS
  Administered 2023-05-24: 750 mg via INTRAVENOUS
  Filled 2023-05-24: qty 15

## 2023-05-24 MED ORDER — ALBUTEROL SULFATE HFA 90 MCG/ACTUATION AEROSOL INHALER - RN
2.0000 | Freq: Once | RESPIRATORY_TRACT | Status: DC | PRN
Start: 2023-05-24 — End: 2023-05-25

## 2023-05-24 MED ORDER — EPINEPHRINE 1 MG/ML (1 ML) INJECTION SOLUTION
0.3000 mg | Freq: Once | INTRAMUSCULAR | Status: DC | PRN
Start: 2023-05-24 — End: 2023-05-25

## 2023-05-24 MED ORDER — PROCHLORPERAZINE MALEATE 10 MG TABLET
10.0000 mg | ORAL_TABLET | Freq: Once | ORAL | Status: DC | PRN
Start: 2023-05-24 — End: 2023-05-25

## 2023-05-24 MED ORDER — ALBUTEROL SULFATE 2.5 MG/3 ML (0.083 %) SOLUTION FOR NEBULIZATION
2.5000 mg | INHALATION_SOLUTION | Freq: Once | RESPIRATORY_TRACT | Status: DC | PRN
Start: 2023-05-24 — End: 2023-05-25

## 2023-05-24 MED ORDER — SODIUM CHLORIDE 0.9 % IV BOLUS
500.0000 mL | INJECTION | Freq: Once | Status: DC | PRN
Start: 2023-05-24 — End: 2023-05-25

## 2023-05-24 MED ORDER — FAMOTIDINE (PF) 20 MG/2 ML INTRAVENOUS SOLUTION
20.0000 mg | Freq: Once | INTRAVENOUS | Status: DC | PRN
Start: 2023-05-24 — End: 2023-05-25

## 2023-05-24 MED ORDER — ACETAMINOPHEN 325 MG TABLET
975.0000 mg | ORAL_TABLET | Freq: Once | ORAL | Status: DC | PRN
Start: 2023-05-24 — End: 2023-05-25

## 2023-05-24 NOTE — Nurses Notes (Signed)
Summary: pt arrived an received iv injectafer with no reactions or distress noted during or after infusion.assessment WNL.treatment complete iv removed with pressure dressing applied.down via walking at DC no concerns voiced upon leaving floor.Ellyson Rarick, LPN

## 2023-08-04 ENCOUNTER — Encounter (INDEPENDENT_AMBULATORY_CARE_PROVIDER_SITE_OTHER): Payer: Self-pay | Admitting: Family

## 2023-08-07 ENCOUNTER — Ambulatory Visit (INDEPENDENT_AMBULATORY_CARE_PROVIDER_SITE_OTHER): Payer: Medicare Other | Admitting: NURSE PRACTITIONER

## 2023-08-07 ENCOUNTER — Encounter (INDEPENDENT_AMBULATORY_CARE_PROVIDER_SITE_OTHER): Payer: Self-pay | Admitting: NURSE PRACTITIONER

## 2023-08-07 ENCOUNTER — Other Ambulatory Visit: Payer: Self-pay

## 2023-08-07 VITALS — BP 126/63 | HR 81 | Resp 18 | Ht 62.99 in | Wt 127.8 lb

## 2023-08-07 DIAGNOSIS — E785 Hyperlipidemia, unspecified: Secondary | ICD-10-CM

## 2023-08-07 DIAGNOSIS — K219 Gastro-esophageal reflux disease without esophagitis: Secondary | ICD-10-CM

## 2023-08-07 DIAGNOSIS — I10 Essential (primary) hypertension: Secondary | ICD-10-CM | POA: Insufficient documentation

## 2023-08-07 DIAGNOSIS — D508 Other iron deficiency anemias: Secondary | ICD-10-CM

## 2023-08-07 DIAGNOSIS — E039 Hypothyroidism, unspecified: Secondary | ICD-10-CM

## 2023-08-07 DIAGNOSIS — E559 Vitamin D deficiency, unspecified: Secondary | ICD-10-CM | POA: Insufficient documentation

## 2023-08-07 MED ORDER — MONTELUKAST 10 MG TABLET
10.0000 mg | ORAL_TABLET | Freq: Every evening | ORAL | 1 refills | Status: AC
Start: 2023-08-07 — End: ?

## 2023-08-07 MED ORDER — LEVOTHYROXINE 75 MCG TABLET
75.0000 ug | ORAL_TABLET | Freq: Every morning | ORAL | 1 refills | Status: AC
Start: 2023-08-07 — End: ?

## 2023-08-07 MED ORDER — LISINOPRIL 10 MG TABLET
15.0000 mg | ORAL_TABLET | Freq: Every day | ORAL | 1 refills | Status: DC
Start: 2023-08-07 — End: 2024-02-05

## 2023-08-07 MED ORDER — ERGOCALCIFEROL (VITAMIN D2) 1,250 MCG (50,000 UNIT) CAPSULE
50000.0000 [IU] | ORAL_CAPSULE | ORAL | 1 refills | Status: DC
Start: 2023-08-07 — End: 2024-03-28

## 2023-08-07 NOTE — Assessment & Plan Note (Signed)
Routine monitoring labs ordered, will evaluate for any significant abnormalities and discuss changes with patient to current management as necessary based on results.     Condition stable will continue current therapy with lisinopril.

## 2023-08-07 NOTE — Progress Notes (Signed)
INTERNAL MEDICINE, [facility]  [address]  [place] [address]       Name: Katherine [name] MRN:  [id number]   Date: 08/07/2023 Age: 72 y.o.       Chief Complaint:    Chief Complaint   Patient presents with    Follow Up 6 Months        HPI:  Katherine [name] is a 72 y.o. female who is here today for routine f/u, chronic disease management.     She states that overall she is doing well. She is compliant with taking all medications.     She continues to following with [facility] hemtology for iron infusions.     10+ ROS performed and is negative     She denies any current problems or complaints.         Past Medical History:  Past Medical History:   Diagnosis Date    Anemia     B12 deficiency     Esophageal reflux     Essential hypertension     Hx of breast cancer     Hx of transfusion     Hypercholesterolemia     Osteoporosis     Thyroid disease     Vitamin D deficiency          Past Surgical History:   Procedure Laterality Date    HX BREAST LUMPECTOMY      HX COLONOSCOPY      HX RADICAL MASTECTOMY      HX TUBAL LIGATION        Current Outpatient Medications   Medication Sig    albuterol sulfate (PROVENTIL OR VENTOLIN OR PROAIR) 90 mcg/actuation Inhalation oral inhaler Take 1-2 Puffs by inhalation Every 6 hours as needed    cyanocobalamin (VITAMIN B12) 2,500 mcg Sublingual Tablet, Sublingual Place 1 Tablet (2,500 mcg total) under the tongue Once a day    ergocalciferol, vitamin D2, (DRISDOL) 1,250 mcg (50,000 unit) Oral Capsule Take 1 Capsule (50,000 Units total) by mouth Every 7 days    lactobacillus rhamnosus, GG, (CULTURELLE) 10 billion cell Oral Capsule Take 1 Capsule by mouth Once a day    levothyroxine (SYNTHROID) 75 mcg Oral Tablet Take 1 Tablet (75 mcg total) by mouth Every morning    lisinopriL (PRINIVIL) 10 mg Oral Tablet Take 1.5 Tablets (15 mg total) by mouth Once a day Takes 1.5 tables  so she has been doing 15 mg.    montelukast (SINGULAIR) 10 mg Oral Tablet Take 1 Tablet (10 mg  total) by mouth Every evening     Allergies   Allergen Reactions    Boniva [Ibandronate]     Lipitor [Atorvastatin]        Family History:  Family Medical History:       Problem Relation (Age of Onset)    Blood Clots Daughter    Hypertension (High Blood Pressure) Mother    Lung Cancer Father    Thyroid Disease Mother              Social History:   Social History     Tobacco Use   Smoking Status Former    Types: Cigarettes   Smokeless Tobacco Never     Social History     Substance and Sexual Activity   Alcohol Use Never     Social History     Occupational History    Not on file       Review of Systems:  Review  of systems as discussed in HPI    Problem List:  Patient Active Problem List   Diagnosis    Ductal carcinoma in situ (DCIS) of right breast    Iron deficiency anemia secondary to inadequate dietary iron intake    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   Constitutional:       General: She is not in acute distress.     Appearance: Normal appearance. She is not ill-appearing or diaphoretic.   HENT:      Head: Normocephalic and atraumatic.   Eyes:      Extraocular Movements: Extraocular movements intact.      Pupils: Pupils are equal, round, and reactive to light.   Cardiovascular:      Rate and Rhythm: Normal rate.      Pulses: Normal pulses.      Heart sounds: Normal heart sounds, S1 normal and S2 normal.   Pulmonary:      Effort: Pulmonary effort is normal.      Breath sounds: Normal breath sounds.   Abdominal:      General: Abdomen is flat. Bowel sounds are normal.      Palpations: Abdomen is soft.   Musculoskeletal:      Cervical  back: Normal range of motion and neck supple.   Skin:     General: Skin is warm and dry.      Capillary Refill: Capillary refill takes less than 2 seconds.   Neurological:      General: No focal deficit present.      Mental Status: She is alert and oriented to person, place, and time.   Psychiatric:         Mood and Affect: Mood normal.        Data Reviewed:  Hospital Outpatient Visit on 05/09/2023   Component Date Value Ref Range Status    SODIUM 05/09/2023 141  136 - 145 mmol/L Final    POTASSIUM 05/09/2023 3.7  3.5 - 5.1 mmol/L Final    CHLORIDE 05/09/2023 109 (H)  98 - 107 mmol/L Final    CO2 TOTAL 05/09/2023 23  21 - 31 mmol/L Final    ANION GAP 05/09/2023 9  4 - 13 mmol/L Final    BUN 05/09/2023 11  7 - 25 mg/dL Final    CREATININE 23/55/7322 0.87  0.60 - 1.30 mg/dL Final    BUN/CREA RATIO 05/09/2023 13  6 - 22 Final    ESTIMATED GFR 05/09/2023 71  >59 mL/min/1.64m^2 Final    ALBUMIN 05/09/2023 4.5  3.5 - 5.7 g/dL Final    CALCIUM 02/54/2706 9.8  8.6 - 10.3 mg/dL Final    GLUCOSE 23/76/2831 79  74 - 109 mg/dL Final    ALKALINE PHOSPHATASE 05/09/2023 45  34 - 104 U/L Final    ALT (SGPT) 05/09/2023 13  7 - 52 U/L Final    AST (SGOT) 05/09/2023 18  13 - 39 U/L Final    BILIRUBIN TOTAL 05/09/2023 0.4  0.3 - 1.2 mg/dL Final    PROTEIN TOTAL 05/09/2023 7.2  6.4 - 8.9 g/dL Final    ALBUMIN/GLOBULIN RATIO 05/09/2023 1.7 (H)  0.8 - 1.4 Final    OSMOLALITY, CALCULATED 05/09/2023 280  270 - 290 mOsm/kg Final    CALCIUM, CORRECTED 05/09/2023 9.4  8.9 - 10.8 mg/dL Final    GLOBULIN 44/01/4741 2.7 (L)  2.9 - 5.4 Final    TOTAL IRON BINDING CAPACITY 05/09/2023 458 (H)  250 - 450 ug/dL Final    IRON (TRANSFERRIN) SATURATION 05/09/2023 9 (L)  15 - 50 % Final    IRON 05/09/2023 42 (L)  50 - 212 ug/dL Final    TRANSFERRIN 59/56/3875 327  203 - 362 mg/dL Final    UIBC 64/33/2951 416 (H)  130 - 375 ug/dL Final    FERRITIN 88/41/6606 16  11 - 336 ng/mL Final    WBC 05/09/2023 6.1  3.8 - 11.8 x10^3/uL Final    RBC 05/09/2023 4.70   3.63 - 4.92 x10^6/uL Final    HGB 05/09/2023 13.4  10.9 - 14.3 g/dL Final    HCT 30/16/0109 40.0  31.2 - 41.9 % Final    MCV 05/09/2023 85.2  75.5 - 95.3 fL Final    MCH 05/09/2023 28.6  24.7 - 32.8 pg Final    MCHC 05/09/2023 33.5  32.3 - 35.6 g/dL Final    RDW 32/35/5732 13.2  12.3 - 17.7 % Final    PLATELETS 05/09/2023 345  140 - 440 x10^3/uL Final    MPV 05/09/2023 8.4  7.9 - 10.8 fL Final    NEUTROPHIL % 05/09/2023 62  43 - 77 % Final    LYMPHOCYTE % 05/09/2023 23  16 - 44 % Final    MONOCYTE % 05/09/2023 11  5 - 13 % Final    EOSINOPHIL % 05/09/2023 4  % Final    BASOPHIL % 05/09/2023 1  0 - 1 % Final    NEUTROPHIL # 05/09/2023 3.80  1.85 - 7.80 x10^3/uL Final    LYMPHOCYTE # 05/09/2023 1.40  1.00 - 3.00 x10^3/uL Final    MONOCYTE # 05/09/2023 0.60  0.30 - 1.00 x10^3/uL Final    EOSINOPHIL # 05/09/2023 0.20  0.00 - 0.50 x10^3/uL Final    BASOPHIL # 05/09/2023 0.10  0.00 - 0.10 x10^3/uL Final    RETICULOCYTE % AUTOMATED 05/09/2023 1.77  0.5 - 2.17 % Final    IMMATURE RETIC FRACTION 05/09/2023 0.33  0.29 - 0.53 Final    RETICULOCYTES COUNT # AUTOMATED 05/09/2023 0.0831  0.0221 - 0.0963 10(6)/uL Final        Health Maintenance:  Health Maintenance   Topic Date Due    Hepatitis C screening  Never done    Shingles Vaccine (1 of 2) Never done    Medicare Annual Wellness Visit  Never done    Influenza Vaccine (1) Never done    Covid-19 Vaccine (3 - 2023-24 season) 07/16/2023    Depression Screening  09/15/2023    Mammography-Unilateral  12/07/2024    Colonoscopy  11/04/2027    Adult Tdap-Td (2 - Td or Tdap) 11/14/2029    Osteoporosis screening  12/03/2031    Pneumococcal Vaccination, Age 51+  Completed        Assessment & Plan   Problem List Items Addressed This Visit          Cardiovascular System    Essential hypertension (Chronic)     Routine monitoring labs ordered, will evaluate for any significant abnormalities and  discuss changes with patient to current management as necessary based on results.     Condition  stable will continue current therapy with lisinopril.               Digestive    GERD (gastroesophageal reflux disease)    Relevant Orders    COMPREHENSIVE METABOLIC PNL, FASTING       Endocrine    Hypothyroidism (Chronic)     Routine monitoring labs ordered, will evaluate for any significant abnormalities and discuss changes with patient to current management as necessary based on results.     Condition stable will continue current therapy with synthroid.           Relevant Medications    levothyroxine (SYNTHROID) 75 mcg Oral Tablet    Other Relevant Orders    THYROID STIMULATING HORMONE WITH FREE T4 REFLEX    Vitamin D deficiency (Chronic)     Condition stable will continue current therapy with vitamin d replacement.               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           Other Visit Diagnoses       Hypertension, unspecified type  (Chronic)   -  Primary    Relevant Medications    lisinopriL (PRINIVIL) 10 mg Oral Tablet    Hyperlipidemia, unspecified hyperlipidemia type  (Chronic)       Relevant Orders    LIPID PANEL               Chronic disease management follow up. Patient is taking and tolerating all medications well without complaint of negative side effects.   Past medical history/medications/labs/ and current plan of care summarized and discussed in detail with patient during visit.   Return to clinic with new or worsening symptoms.          Follow up:  Return in about 6 months (around 02/04/2024), or if symptoms worsen or fail to improve.    This note was partially created using voice recognition software and is inherently subject to errors including those of syntax and "sound alike " substitutions which may escape proof reading.  In such instances, original meaning may be extrapolated by contextual derivation.    Macario Golds, FNP-C  08/07/2023, 12:35

## 2023-08-07 NOTE — Assessment & Plan Note (Signed)
Patient continues to receive infusions and follows with [facility] hematology.

## 2023-08-07 NOTE — Assessment & Plan Note (Signed)
Routine monitoring labs ordered, will evaluate for any significant abnormalities and discuss changes with patient to current management as necessary based on results.   Condition stable will continue current therapy with synthroid

## 2023-08-07 NOTE — Assessment & Plan Note (Signed)
Condition stable will continue current therapy with vitamin d replacement.

## 2023-08-16 ENCOUNTER — Ambulatory Visit (INDEPENDENT_AMBULATORY_CARE_PROVIDER_SITE_OTHER): Payer: Self-pay | Admitting: NURSE PRACTITIONER

## 2023-08-16 ENCOUNTER — Other Ambulatory Visit (INDEPENDENT_AMBULATORY_CARE_PROVIDER_SITE_OTHER): Payer: Self-pay | Admitting: NURSE PRACTITIONER

## 2023-08-16 ENCOUNTER — Ambulatory Visit (INDEPENDENT_AMBULATORY_CARE_PROVIDER_SITE_OTHER): Payer: Self-pay

## 2023-08-16 DIAGNOSIS — D508 Other iron deficiency anemias: Secondary | ICD-10-CM

## 2023-08-18 ENCOUNTER — Ambulatory Visit (INDEPENDENT_AMBULATORY_CARE_PROVIDER_SITE_OTHER): Payer: Self-pay

## 2023-08-18 ENCOUNTER — Ambulatory Visit (INDEPENDENT_AMBULATORY_CARE_PROVIDER_SITE_OTHER): Payer: Self-pay | Admitting: NURSE PRACTITIONER

## 2023-08-18 NOTE — Telephone Encounter (Addendum)
Called pt back to assist, accepted appt for 08/23/23 at 0830.          Regarding: r/s appt  ----- Message from Karna Christmas sent at 08/18/2023  8:14 AM EDT -----  Copied From CRM #[id number].[name], Katherine D called with a general question or another reason. Pt needs to r/s her appt today. She still does not have power.    Thank you

## 2023-08-22 NOTE — Cancer Center Note (Signed)
Katherine [name]  [id number]  [birth year]   08/23/2023       Department of Hematology/Oncology  Return Patient Visit           REFERRING PROVIDER:  Henderson Baltimore, FNP-BC  [address]  [place],  [place] [address]      REASON FOR OFFICE VISIT:  Ongoing management and evaluation of  Right DCIS and iron deficiency anemia.      HISTORY OF PRESENT ILLNESS:  Katherine [name] is a 72 y.o. female who presents to today alone for evaluation of DCIS and Iron deficiency.    Patient reports that she is feeling weak and worn out.   She denies any PICA, Shortness of breath, chest pain, or bleeding.   She does report a few days of dark stools, but they have returned to normal.    She reports she had recent bronchitis and coughed so much she broke a vertebre in her back.       Initial Diagnosis:  04/1998-  Right breast Paget's Disease with DCIS   02/08/2000 Right breast Paget's disease reoccurrence DCIS    Surgeries:  6/28/199 Right breast segmental resection with positive margins.   06/1998  Right breast wide marginal incision with questionable focus of residual DCIS  02/23/2000 right breast mastectomy    Treatment History:  Tamoxifen 10 mg PO BID for 5 years.   Radiation therapy 5040 cGy in 28 fractions over six weeks.     REVIEW OF SYSTEMS:  Review of Systems   Constitutional:  Negative for chills, fatigue and fever.        Denies PICA   HENT:  Negative.     Eyes: Negative.    Respiratory:  Negative for cough and shortness of breath.    Cardiovascular: Negative.  Negative for chest pain.   Gastrointestinal: Negative.  Negative for blood in stool, diarrhea, nausea and vomiting.   Endocrine: Negative.    Genitourinary: Negative.  Negative for difficulty urinating.    Musculoskeletal: Negative.  Negative for arthralgias and back pain.   Skin: Negative.    Neurological: Negative.  Negative for dizziness and headaches.   Hematological: Negative.  Negative for adenopathy. Does not bruise/bleed easily.   Psychiatric/Behavioral: Negative.           Past Medical History:   Diagnosis Date    Anemia     B12 deficiency     Esophageal reflux     Essential hypertension     Hx of breast cancer     Hx of transfusion     Hypercholesterolemia     Osteoporosis     Thyroid disease     Vitamin D deficiency            Past Surgical History:   Procedure Laterality Date    HX BREAST LUMPECTOMY      HX COLONOSCOPY      HX RADICAL MASTECTOMY      HX TUBAL LIGATION             Social History     Socioeconomic History    Marital status: Married     Spouse name: Not on file    Number of children: Not on file    Years of education: Not on file    Highest education level: Not on file   Occupational History    Not on file   Tobacco Use    Smoking status: Former     Types: Cigarettes    Smokeless  tobacco: Never   Vaping Use    Vaping status: Never Used   Substance and Sexual Activity    Alcohol use: Never    Drug use: Never    Sexual activity: Not on file   Other Topics Concern    Not on file   Social History Narrative    Not on file     Social Determinants of Health     Financial Resource Strain: Low Risk  (09/27/2022)    Financial Resource Strain     SDOH Financial: No   Transportation Needs: Low Risk  (09/27/2022)    Transportation Needs     SDOH Transportation: No   Social Connections: Low Risk  (09/27/2022)    Social Connections     SDOH Social Isolation: 5 or more times a week   Intimate Partner Violence: High Risk (09/27/2022)    Intimate Partner Violence     SDOH Domestic Violence: No   Housing Stability: Low Risk  (09/27/2022)    Housing Stability     SDOH Housing Situation: I have housing.     SDOH Housing Worry: No       Social History     Social History Narrative    Not on file       Social History     Substance and Sexual Activity   Drug Use Never       Family Medical History:       Problem Relation (Age of Onset)    Blood Clots Daughter    Hypertension (High Blood Pressure) Mother    Lung Cancer Father    Thyroid Disease Mother              Current Outpatient  Medications   Medication Sig    albuterol sulfate (PROVENTIL OR VENTOLIN OR PROAIR) 90 mcg/actuation Inhalation oral inhaler Take 1-2 Puffs by inhalation Every 6 hours as needed    cyanocobalamin (VITAMIN B12) 2,500 mcg Sublingual Tablet, Sublingual Place 1 Tablet (2,500 mcg total) under the tongue Once a day    ergocalciferol, vitamin D2, (DRISDOL) 1,250 mcg (50,000 unit) Oral Capsule Take 1 Capsule (50,000 Units total) by mouth Every 7 days    lactobacillus rhamnosus, GG, (CULTURELLE) 10 billion cell Oral Capsule Take 1 Capsule by mouth Once a day    levothyroxine (SYNTHROID) 75 mcg Oral Tablet Take 1 Tablet (75 mcg total) by mouth Every morning    lisinopriL (PRINIVIL) 10 mg Oral Tablet Take 1.5 Tablets (15 mg total) by mouth Once a day Takes 1.5 tables  so she has been doing 15 mg.    montelukast (SINGULAIR) 10 mg Oral Tablet Take 1 Tablet (10 mg total) by mouth Every evening       Allergies   Allergen Reactions    Boniva [Ibandronate]     Lipitor [Atorvastatin]          PHYSICAL EXAM:  There were no vitals taken for this visit.       ECOG Status: (0) Fully active, able to carry on all predisease performance without restriction   Physical Exam  Constitutional:       Appearance: Normal appearance.   Cardiovascular:      Rate and Rhythm: Normal rate and regular rhythm.      Pulses: Normal pulses.      Heart sounds: Normal heart sounds.   Pulmonary:      Effort: Pulmonary effort is normal.      Breath sounds: Normal breath sounds.   Chest:  Comments: Declines breast exam  Abdominal:      Palpations: Abdomen is soft.   Lymphadenopathy:      Head:      Right side of head: No submental, submandibular, tonsillar, preauricular, posterior auricular or occipital adenopathy.      Left side of head: No submental, submandibular, tonsillar, preauricular, posterior auricular or occipital adenopathy.      Cervical: No cervical adenopathy.      Right cervical: No superficial, deep or posterior cervical adenopathy.     Left  cervical: No superficial, deep or posterior cervical adenopathy.      Upper Body:      Right upper body: No supraclavicular or axillary adenopathy.      Left upper body: No supraclavicular or axillary adenopathy.   Skin:     General: Skin is warm and dry.   Neurological:      General: No focal deficit present.      Mental Status: She is alert and oriented to person, place, and time. Mental status is at baseline.         Radiology:      LABS:         CBC  Diff   Lab Results   Component Value Date/Time    WBC 6.1 05/09/2023 10:28 AM    HGB 13.4 05/09/2023 10:28 AM    HCT 40.0 05/09/2023 10:28 AM    PLTCNT 345 05/09/2023 10:28 AM    RBC 4.70 05/09/2023 10:28 AM    MCV 85.2 05/09/2023 10:28 AM    MCHC 33.5 05/09/2023 10:28 AM    MCH 28.6 05/09/2023 10:28 AM    RDW 13.2 05/09/2023 10:28 AM    MPV 8.4 05/09/2023 10:28 AM    Lab Results   Component Value Date/Time    PMNS 62 05/09/2023 10:28 AM    LYMPHOCYTES 23 05/09/2023 10:28 AM    EOSINOPHIL 4 05/09/2023 10:28 AM    MONOCYTES 11 05/09/2023 10:28 AM    BASOPHILS 1 05/09/2023 10:28 AM    BASOPHILS 0.10 05/09/2023 10:28 AM    PMNABS 3.80 05/09/2023 10:28 AM    LYMPHSABS 1.40 05/09/2023 10:28 AM    EOSABS 0.20 05/09/2023 10:28 AM    MONOSABS 0.60 05/09/2023 10:28 AM            Comprehensive Metabolic Profile    Lab Results   Component Value Date    SODIUM 141 05/09/2023    POTASSIUM 3.7 05/09/2023    CHLORIDE 109 (H) 05/09/2023    CO2 23 05/09/2023    ANIONGAP 9 05/09/2023    BUN 11 05/09/2023    CREATININE 0.87 05/09/2023    ALBUMIN 4.5 05/09/2023    CALCIUM 9.8 05/09/2023    GLUCOSENF 79 05/09/2023    ALKPHOS 45 05/09/2023    ALT 13 05/09/2023    AST 18 05/09/2023    TOTBILIRUBIN 0.4 05/09/2023    TOTALPROTEIN 7.2 05/09/2023              IRON   Date Value Ref Range Status   05/09/2023 42 (L) 50 - 212 ug/dL Final     FERRITIN   Date Value Ref Range Status   05/09/2023 16 11 - 336 ng/mL Final     TOTAL IRON BINDING CAPACITY   Date Value Ref Range Status   05/09/2023 458  (H) 250 - 450 ug/dL Final     UIBC   Date Value Ref Range Status   05/09/2023 416 (H) 130 - 375 ug/dL Final  IRON (TRANSFERRIN) SATURATION   Date Value Ref Range Status   05/09/2023 9 (L) 15 - 50 % Final                  ASSESSMENT:    ICD-10-CM    1. Iron deficiency anemia secondary to inadequate dietary iron intake  D50.8                  PLAN:   1. Iron-deficiency anemia:  replaced at this time.  Asymptomatic at this time.   Labs completed today.     Will order Injectafer as her number as low again.                          2.  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  She continues to have yearly mammograms as scheduled.  3. Former smoker of approximately 20 pack years.   This was cancelled as they have no updated to the new guidelines yet.       No follow-ups on file.     Katherine [name] was given the chance to ask questions, and these were answered to their satisfaction. The patient is welcome to call with any questions or concerns in the meantime.     On the day of the encounter, I spent a total of 39 minutes on this patient encounter including review of historical information, examination, documentation and post-visit activities.     Marvene Staff APRN, FNP-BC, AOCNP, 08/22/2023 , 16:08     You can see your note(s) in [facility]. It is common for you to encounter certain medical terminology which may be unfamiliar to you. You might see results before your provider does so please give at least 2 business days for review. Please have this understanding, that NOT all abnormal results are significant. Our office will contact you for any urgent or emergent action if necessary. If you have any questions or concerns, feel free to send a MyChart message or call the office. Please call with any new or concerning symptoms.       This note was partially generated using MModal Fluency Direct system, and there may be some incorrect words, spellings, and punctuation  that were not noted in checking the note before saving.     CC:  [employer], FNP-BC  [address]  [place] [address]

## 2023-08-23 ENCOUNTER — Encounter (INDEPENDENT_AMBULATORY_CARE_PROVIDER_SITE_OTHER): Payer: Self-pay | Admitting: NURSE PRACTITIONER

## 2023-08-23 ENCOUNTER — Inpatient Hospital Stay (INDEPENDENT_AMBULATORY_CARE_PROVIDER_SITE_OTHER)
Admission: RE | Admit: 2023-08-23 | Discharge: 2023-08-23 | Disposition: A | Discharge status: Home / Self Care | Payer: Medicare Other | Source: Ambulatory Visit | Attending: NURSE PRACTITIONER | Admitting: NURSE PRACTITIONER

## 2023-08-23 ENCOUNTER — Ambulatory Visit: Payer: Medicare Other | Attending: NURSE PRACTITIONER | Admitting: NURSE PRACTITIONER

## 2023-08-23 ENCOUNTER — Other Ambulatory Visit: Payer: Self-pay

## 2023-08-23 VITALS — BP 125/65 | HR 73 | Temp 97.5°F | Ht 63.0 in | Wt 129.4 lb

## 2023-08-23 DIAGNOSIS — Z09 Encounter for follow-up examination after completed treatment for conditions other than malignant neoplasm: Secondary | ICD-10-CM | POA: Insufficient documentation

## 2023-08-23 DIAGNOSIS — E039 Hypothyroidism, unspecified: Secondary | ICD-10-CM

## 2023-08-23 DIAGNOSIS — Z87891 Personal history of nicotine dependence: Secondary | ICD-10-CM | POA: Insufficient documentation

## 2023-08-23 DIAGNOSIS — Z86000 Personal history of in-situ neoplasm of breast: Secondary | ICD-10-CM | POA: Insufficient documentation

## 2023-08-23 DIAGNOSIS — D508 Other iron deficiency anemias: Secondary | ICD-10-CM | POA: Insufficient documentation

## 2023-08-23 DIAGNOSIS — E785 Hyperlipidemia, unspecified: Secondary | ICD-10-CM

## 2023-08-23 LAB — COMPREHENSIVE METABOLIC PANEL, NON-FASTING
ALBUMIN/GLOBULIN RATIO: 1.6 — ABNORMAL HIGH (ref 0.8–1.4)
ALBUMIN: 4.6 g/dL (ref 3.5–5.7)
ALKALINE PHOSPHATASE: 63 U/L (ref 34–104)
ALT (SGPT): 15 U/L (ref 7–52)
ANION GAP: 6 mmol/L (ref 4–13)
AST (SGOT): 19 U/L (ref 13–39)
BILIRUBIN TOTAL: 0.6 mg/dL (ref 0.3–1.0)
BUN/CREA RATIO: 12 (ref 6–22)
BUN: 12 mg/dL (ref 7–25)
CALCIUM, CORRECTED: 9.4 mg/dL (ref 8.9–10.8)
CALCIUM: 9.9 mg/dL (ref 8.6–10.3)
CHLORIDE: 108 mmol/L — ABNORMAL HIGH (ref 98–107)
CO2 TOTAL: 25 mmol/L (ref 21–31)
CREATININE: 0.97 mg/dL (ref 0.60–1.30)
ESTIMATED GFR: 62 mL/min/{1.73_m2} (ref >59)
GLOBULIN: 2.9 (ref 2.9–5.4)
GLUCOSE: 81 mg/dL (ref 74–109)
OSMOLALITY, CALCULATED: 276 mosm/kg (ref 270–290)
POTASSIUM: 3.6 mmol/L (ref 3.5–5.1)
PROTEIN TOTAL: 7.5 g/dL (ref 6.4–8.9)
SODIUM: 139 mmol/L (ref 136–145)

## 2023-08-23 LAB — LIPID PANEL
CHOL/HDL RATIO: 3.3
CHOLESTEROL: 192 mg/dL (ref <200)
HDL CHOL: 58 mg/dL (ref >=40)
LDL CALC: 100 mg/dL (ref 0–100)
TRIGLYCERIDES: 170 mg/dL — ABNORMAL HIGH (ref <=150)
VLDL CALC: 34 mg/dL (ref 0–50)

## 2023-08-23 LAB — CBC WITH DIFF
BASOPHIL #: 0.1 10*3/uL (ref 0.00–0.10)
BASOPHIL %: 1 % (ref 0–1)
EOSINOPHIL #: 0.2 10*3/uL (ref 0.00–0.50)
EOSINOPHIL %: 3 % (ref 1–7)
HCT: 40.7 % (ref 31.2–41.9)
HGB: 14.1 g/dL (ref 10.9–14.3)
LYMPHOCYTE #: 1.4 10*3/uL (ref 1.00–3.00)
LYMPHOCYTE %: 24 % (ref 16–44)
MCH: 29.5 pg (ref 24.7–32.8)
MCHC: 34.6 g/dL (ref 32.3–35.6)
MCV: 85.4 fL (ref 75.5–95.3)
MONOCYTE #: 0.6 10*3/uL (ref 0.30–1.00)
MONOCYTE %: 9 % (ref 5–13)
MPV: 8.5 fL (ref 7.9–10.8)
NEUTROPHIL #: 3.8 10*3/uL (ref 1.85–7.80)
NEUTROPHIL %: 63 % (ref 43–77)
PLATELETS: 284 10*3/uL (ref 140–440)
RBC: 4.77 10*6/uL (ref 3.63–4.92)
RDW: 14.9 % (ref 12.3–17.7)
WBC: 6 10*3/uL (ref 3.8–11.8)

## 2023-08-23 LAB — IRON TRANSFERRIN AND TIBC
IRON (TRANSFERRIN) SATURATION: 36 % (ref 15–50)
IRON: 124 ug/dL (ref 50–212)
TOTAL IRON BINDING CAPACITY: 346 ug/dL (ref 250–450)
TRANSFERRIN: 247 mg/dL (ref 203–362)
UIBC: 222 ug/dL (ref 130–375)

## 2023-08-23 LAB — FERRITIN: FERRITIN: 431 ng/mL — ABNORMAL HIGH (ref 11–336)

## 2023-08-23 LAB — THYROID STIMULATING HORMONE WITH FREE T4 REFLEX: TSH: 3.742 u[IU]/mL (ref 0.450–5.330)

## 2023-10-17 ENCOUNTER — Ambulatory Visit (INDEPENDENT_AMBULATORY_CARE_PROVIDER_SITE_OTHER): Payer: Self-pay | Admitting: NURSE PRACTITIONER

## 2023-10-17 NOTE — Telephone Encounter (Addendum)
Called [name] back to discuss, she prefers afternoon, informed we have no aftn appts until mid January; she is agreeable to that, states she feels well at this time, but if changes she will call and possibly have labs sooner. For now accepted appt 11/29/23 at 330pm, but would like placed on call list.          Regarding: Reschedule  ----- Message from Dorena Bodo sent at 10/17/2023  9:01 AM EST -----  Copied From CRM [id number].Katherine [name]:  Katherine [name] called to reschedule an appointment.   The [name] called to see if the appt on 12/10 could be rescheduled to the week after Christmas. She would prefer after 11am. Please call.  Thanks

## 2023-10-24 ENCOUNTER — Ambulatory Visit (INDEPENDENT_AMBULATORY_CARE_PROVIDER_SITE_OTHER): Payer: Self-pay | Admitting: NURSE PRACTITIONER

## 2023-10-24 ENCOUNTER — Ambulatory Visit (INDEPENDENT_AMBULATORY_CARE_PROVIDER_SITE_OTHER): Payer: Self-pay

## 2023-11-29 ENCOUNTER — Ambulatory Visit: Payer: Medicare Other | Attending: NURSE PRACTITIONER | Admitting: NURSE PRACTITIONER

## 2023-11-29 ENCOUNTER — Other Ambulatory Visit: Payer: Self-pay

## 2023-11-29 ENCOUNTER — Encounter (INDEPENDENT_AMBULATORY_CARE_PROVIDER_SITE_OTHER): Payer: Self-pay | Admitting: NURSE PRACTITIONER

## 2023-11-29 ENCOUNTER — Ambulatory Visit (INDEPENDENT_AMBULATORY_CARE_PROVIDER_SITE_OTHER)
Admission: RE | Admit: 2023-11-29 | Discharge: 2023-11-29 | Disposition: A | Discharge status: Home / Self Care | Payer: Medicare Other | Source: Ambulatory Visit | Attending: NURSE PRACTITIONER | Admitting: NURSE PRACTITIONER

## 2023-11-29 VITALS — BP 114/59 | HR 84 | Temp 97.8°F | Ht 63.0 in | Wt 126.2 lb

## 2023-11-29 DIAGNOSIS — C50011 Malignant neoplasm of nipple and areola, right female breast: Secondary | ICD-10-CM

## 2023-11-29 DIAGNOSIS — Z87891 Personal history of nicotine dependence: Secondary | ICD-10-CM | POA: Insufficient documentation

## 2023-11-29 DIAGNOSIS — Z86000 Personal history of in-situ neoplasm of breast: Secondary | ICD-10-CM | POA: Insufficient documentation

## 2023-11-29 DIAGNOSIS — Z09 Encounter for follow-up examination after completed treatment for conditions other than malignant neoplasm: Secondary | ICD-10-CM | POA: Insufficient documentation

## 2023-11-29 DIAGNOSIS — D0511 Intraductal carcinoma in situ of right breast: Secondary | ICD-10-CM

## 2023-11-29 DIAGNOSIS — D508 Other iron deficiency anemias: Secondary | ICD-10-CM

## 2023-11-29 DIAGNOSIS — Z9011 Acquired absence of right breast and nipple: Secondary | ICD-10-CM | POA: Insufficient documentation

## 2023-11-29 DIAGNOSIS — Z923 Personal history of irradiation: Secondary | ICD-10-CM | POA: Insufficient documentation

## 2023-11-29 LAB — CBC WITH DIFF
BASOPHIL #: 0.1 10*3/uL (ref 0.00–0.10)
BASOPHIL %: 1 % (ref 0–1)
EOSINOPHIL #: 0.2 10*3/uL (ref 0.00–0.50)
EOSINOPHIL %: 3 % (ref 1–7)
HCT: 43.9 % — ABNORMAL HIGH (ref 31.2–41.9)
HGB: 15.1 g/dL — ABNORMAL HIGH (ref 10.9–14.3)
LYMPHOCYTE #: 2.1 10*3/uL (ref 1.00–3.00)
LYMPHOCYTE %: 28 % (ref 16–44)
MCH: 30.3 pg (ref 24.7–32.8)
MCHC: 34.5 g/dL (ref 32.3–35.6)
MCV: 88 fL (ref 75.5–95.3)
MONOCYTE #: 0.7 10*3/uL (ref 0.30–1.00)
MONOCYTE %: 8 % (ref 5–13)
MPV: 8.6 fL (ref 7.9–10.8)
NEUTROPHIL #: 4.7 10*3/uL (ref 1.85–7.80)
NEUTROPHIL %: 60 % (ref 43–77)
PLATELETS: 383 10*3/uL (ref 140–440)
RBC: 4.99 10*6/uL — ABNORMAL HIGH (ref 3.63–4.92)
RDW: 12.4 % (ref 12.3–17.7)
WBC: 7.7 10*3/uL (ref 3.8–11.8)

## 2023-11-29 LAB — COMPREHENSIVE METABOLIC PANEL, NON-FASTING
ALBUMIN/GLOBULIN RATIO: 1.6 — ABNORMAL HIGH (ref 0.8–1.4)
ALBUMIN: 4.7 g/dL (ref 3.5–5.7)
ALKALINE PHOSPHATASE: 59 U/L (ref 34–104)
ALT (SGPT): 14 U/L (ref 7–52)
ANION GAP: 8 mmol/L (ref 4–13)
AST (SGOT): 21 U/L (ref 13–39)
BILIRUBIN TOTAL: 0.3 mg/dL (ref 0.3–1.0)
BUN/CREA RATIO: 16 (ref 6–22)
BUN: 15 mg/dL (ref 7–25)
CALCIUM, CORRECTED: 9.9 mg/dL (ref 8.9–10.8)
CALCIUM: 10.5 mg/dL — ABNORMAL HIGH (ref 8.6–10.3)
CHLORIDE: 103 mmol/L (ref 98–107)
CO2 TOTAL: 27 mmol/L (ref 21–31)
CREATININE: 0.93 mg/dL (ref 0.60–1.30)
ESTIMATED GFR: 65 mL/min/{1.73_m2} (ref >59)
GLOBULIN: 2.9 (ref 2.0–3.5)
GLUCOSE: 81 mg/dL (ref 74–109)
OSMOLALITY, CALCULATED: 276 mosm/kg (ref 270–290)
POTASSIUM: 5 mmol/L (ref 3.5–5.1)
PROTEIN TOTAL: 7.6 g/dL (ref 6.4–8.9)
SODIUM: 138 mmol/L (ref 136–145)

## 2023-11-29 LAB — IRON TRANSFERRIN AND TIBC
IRON (TRANSFERRIN) SATURATION: 21 % (ref 15–50)
IRON: 82 ug/dL (ref 50–212)
TOTAL IRON BINDING CAPACITY: 396 ug/dL (ref 250–450)
TRANSFERRIN: 283 mg/dL (ref 203–362)
UIBC: 314 ug/dL (ref 130–375)

## 2023-11-29 LAB — FERRITIN: FERRITIN: 170 ng/mL (ref 11–307)

## 2023-11-29 NOTE — Cancer Center Note (Signed)
Katherine [name]  [id number]  03/12/1951   11/29/2023       Department of Hematology/Oncology  Return Patient Visit           REFERRING PROVIDER:  Henderson Baltimore, FNP-BC  [address]  [place],  [place] [address]      REASON FOR OFFICE VISIT:  Ongoing management and evaluation of  Right DCIS and iron deficiency anemia.      HISTORY OF PRESENT ILLNESS:  Katherine [name] is a 73 y.o. female who presents to today alone for evaluation of DCIS and Iron deficiency.    Patient completed her last of 2 Injectafer treatments on 05/24/23.   Patient reports that she is doing well and is without complaints.  She is having a hard time with the loss of her husband on 11/12/23.   She denies any PCIA.   She is feeling exhausted and tired but is unsure if it is due to caring for her husband or her blood.     Initial Diagnosis:  04/1998-  Right breast Paget's Disease with DCIS   02/08/2000 Right breast Paget's disease recurrence DCIS    Surgeries:  6/28/199 Right breast segmental resection with positive margins.   06/1998  Right breast wide marginal incision with questionable focus of residual DCIS  02/23/2000 right breast mastectomy    Treatment History:  Tamoxifen 10 mg PO BID for 5 years.   Radiation therapy 5040 cGy in 28 fractions over six weeks.     REVIEW OF SYSTEMS:  Review of Systems   Constitutional:  Negative for chills, fatigue and fever.        Denies PICA   HENT:  Negative.     Eyes: Negative.    Respiratory:  Negative for cough and shortness of breath.    Cardiovascular: Negative.  Negative for chest pain.   Gastrointestinal: Negative.  Negative for blood in stool, diarrhea, nausea and vomiting.   Endocrine: Negative.    Genitourinary: Negative.  Negative for difficulty urinating.    Musculoskeletal: Negative.  Negative for arthralgias and back pain.   Skin: Negative.    Neurological: Negative.  Negative for dizziness and headaches.   Hematological: Negative.  Negative for adenopathy. Does not bruise/bleed easily.    Psychiatric/Behavioral: Negative.          Past Medical History:   Diagnosis Date    Anemia     B12 deficiency     Esophageal reflux     Essential hypertension     Hx of breast cancer     Hx of transfusion     Hypercholesterolemia     Osteoporosis     Thyroid disease     Vitamin D deficiency            Past Surgical History:   Procedure Laterality Date    HX BREAST LUMPECTOMY      HX COLONOSCOPY      HX RADICAL MASTECTOMY      HX TUBAL LIGATION             Social History     Socioeconomic History    Marital status: Married     Spouse name: Not on file    Number of children: Not on file    Years of education: Not on file    Highest education level: Not on file   Occupational History    Not on file   Tobacco Use    Smoking status: Former     Types:  Cigarettes    Smokeless tobacco: Never   Vaping Use    Vaping status: Never Used   Substance and Sexual Activity    Alcohol use: Never    Drug use: Never    Sexual activity: Not on file   Other Topics Concern    Not on file   Social History Narrative    Not on file     Social Determinants of Health     Financial Resource Strain: Low Risk  (09/27/2022)    Financial Resource Strain     SDOH Financial: No   Transportation Needs: Low Risk  (09/27/2022)    Transportation Needs     SDOH Transportation: No   Social Connections: Low Risk  (09/27/2022)    Social Connections     SDOH Social Isolation: 5 or more times a week   Intimate Partner Violence: High Risk (09/27/2022)    Intimate Partner Violence     SDOH Domestic Violence: No   Housing Stability: Low Risk  (09/27/2022)    Housing Stability     SDOH Housing Situation: I have housing.     SDOH Housing Worry: No       Social History     Social History Narrative    Not on file       Social History     Substance and Sexual Activity   Drug Use Never       Family Medical History:       Problem Relation (Age of Onset)    Blood Clots Daughter    Hypertension (High Blood Pressure) Mother    Lung Cancer Father    Thyroid Disease  Mother              Current Outpatient Medications   Medication Sig    cyanocobalamin (VITAMIN B12) 2,500 mcg Sublingual Tablet, Sublingual Place 1 Tablet (2,500 mcg total) under the tongue Once a day    ergocalciferol, vitamin D2, (DRISDOL) 1,250 mcg (50,000 unit) Oral Capsule Take 1 Capsule (50,000 Units total) by mouth Every 7 days    lactobacillus rhamnosus, GG, (CULTURELLE) 10 billion cell Oral Capsule Take 1 Capsule by mouth Once a day    levothyroxine (SYNTHROID) 75 mcg Oral Tablet Take 1 Tablet (75 mcg total) by mouth Every morning    lisinopriL (PRINIVIL) 10 mg Oral Tablet Take 1.5 Tablets (15 mg total) by mouth Once a day Takes 1.5 tables  so she has been doing 15 mg. (Patient taking differently: Take 1 Tablet (10 mg total) by mouth Once a day Takes 1.5 tables  so she has been doing 15 mg.)    montelukast (SINGULAIR) 10 mg Oral Tablet Take 1 Tablet (10 mg total) by mouth Every evening       Allergies   Allergen Reactions    Boniva [Ibandronate]     Lipitor [Atorvastatin]          PHYSICAL EXAM:  BP (!) 114/59 (Site: Left Arm, Patient Position: Sitting, Cuff Size: Adult)   Pulse 84   Temp 36.6 C (97.8 F) (Temporal)   Ht 1.6 m (5\' 3" )   Wt 57.2 kg (126 lb 3.2 oz)   SpO2 100%   BMI 22.36 kg/m        ECOG Status: (0) Fully active, able to carry on all predisease performance without restriction   Physical Exam  Constitutional:       Appearance: Normal appearance.   Cardiovascular:      Rate and Rhythm: Normal rate and  regular rhythm.      Pulses: Normal pulses.      Heart sounds: Normal heart sounds.   Pulmonary:      Effort: Pulmonary effort is normal.      Breath sounds: Normal breath sounds.   Abdominal:      Palpations: Abdomen is soft.   Lymphadenopathy:      Head:      Right side of head: No submental, submandibular, tonsillar, preauricular, posterior auricular or occipital adenopathy.      Left side of head: No submental, submandibular, tonsillar, preauricular, posterior auricular or occipital  adenopathy.      Cervical: No cervical adenopathy.      Right cervical: No superficial, deep or posterior cervical adenopathy.     Left cervical: No superficial, deep or posterior cervical adenopathy.      Upper Body:      Right upper body: No supraclavicular or axillary adenopathy.      Left upper body: No supraclavicular or axillary adenopathy.   Skin:     General: Skin is warm and dry.   Neurological:      General: No focal deficit present.      Mental Status: She is alert and oriented to person, place, and time. Mental status is at baseline.         Radiology:      LABS:         CBC  Diff   Lab Results   Component Value Date/Time    WBC 7.7 11/29/2023 03:22 PM    HGB 15.1 (H) 11/29/2023 03:22 PM    HCT 43.9 (H) 11/29/2023 03:22 PM    PLTCNT 383 11/29/2023 03:22 PM    RBC 4.99 (H) 11/29/2023 03:22 PM    MCV 88.0 11/29/2023 03:22 PM    MCHC 34.5 11/29/2023 03:22 PM    MCH 30.3 11/29/2023 03:22 PM    RDW 12.4 11/29/2023 03:22 PM    MPV 8.6 11/29/2023 03:22 PM    Lab Results   Component Value Date/Time    PMNS 60 11/29/2023 03:22 PM    LYMPHOCYTES 28 11/29/2023 03:22 PM    EOSINOPHIL 3 11/29/2023 03:22 PM    MONOCYTES 8 11/29/2023 03:22 PM    BASOPHILS 1 11/29/2023 03:22 PM    BASOPHILS 0.10 11/29/2023 03:22 PM    PMNABS 4.70 11/29/2023 03:22 PM    LYMPHSABS 2.10 11/29/2023 03:22 PM    EOSABS 0.20 11/29/2023 03:22 PM    MONOSABS 0.70 11/29/2023 03:22 PM            Comprehensive Metabolic Profile    Lab Results   Component Value Date    SODIUM 138 11/29/2023    POTASSIUM 5.0 11/29/2023    CHLORIDE 103 11/29/2023    CO2 27 11/29/2023    ANIONGAP 8 11/29/2023    BUN 15 11/29/2023    CREATININE 0.93 11/29/2023    ALBUMIN 4.7 11/29/2023    CALCIUM 10.5 (H) 11/29/2023    GLUCOSENF 81 11/29/2023    ALKPHOS 59 11/29/2023    ALT 14 11/29/2023    AST 21 11/29/2023    TOTBILIRUBIN 0.3 11/29/2023    TOTALPROTEIN 7.6 11/29/2023              IRON   Date Value Ref Range Status   11/29/2023 82 50 - 212 ug/dL Final     FERRITIN    Date Value Ref Range Status   08/23/2023 431 (H) 11 - 336 ng/mL Final  TOTAL IRON BINDING CAPACITY   Date Value Ref Range Status   11/29/2023 396 250 - 450 ug/dL Final     UIBC   Date Value Ref Range Status   11/29/2023 314 130 - 375 ug/dL Final     IRON (TRANSFERRIN) SATURATION   Date Value Ref Range Status   11/29/2023 21 15 - 50 % Final                  ASSESSMENT:    ICD-10-CM    1. Iron deficiency anemia secondary to inadequate dietary iron intake  D50.8       2. Ductal carcinoma in situ (DCIS) of right breast  D05.11       3. 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 Iron-deficiency anemia:  replaced at this time.  Asymptomatic at this time.   Labs completed today.     HGB stable  Iron with normal limits and iron saturation normal.   2.  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  She continues to have yearly mammograms as scheduled.  Next mammogram is due  in 1/25  3. Former smoker of approximately 20 pack years.   Low dose CT was cancelled as they have not updated to the new guidelines yet.   4. Medication review completed in room by myself.        Return in about 2 months (around 01/27/2024).     Katherine [name] was given the chance to ask questions, and these were answered to their satisfaction. The patient is welcome to call with any questions or concerns in the meantime.     On the day of the encounter, I spent a total of  30 minutes on this patient encounter including review of historical information, examination, documentation and post-visit activities.     Marvene Staff APRN, FNP-BC, AOCNP, 11/29/2023 , 16:09     You can see your note(s) in [facility]. It is common for you to encounter certain medical terminology which may be unfamiliar to you. You might see results before your provider does so please give at least 2 business days for review. Please have this understanding, that NOT all abnormal  results are significant. Our office will contact you for any urgent or emergent action if necessary. If you have any questions or concerns, feel free to send a MyChart message or call the office. Please call with any new or concerning symptoms.       This note was partially generated using MModal Fluency Direct system, and there may be some incorrect words, spellings, and punctuation that were not noted in checking the note before saving.     CC:  Macario Golds, FNP-C  [address]  [place] [address]

## 2024-01-25 ENCOUNTER — Telehealth (INDEPENDENT_AMBULATORY_CARE_PROVIDER_SITE_OTHER): Payer: Self-pay | Admitting: NURSE PRACTITIONER

## 2024-01-25 DIAGNOSIS — J069 Acute upper respiratory infection, unspecified: Secondary | ICD-10-CM

## 2024-01-25 MED ORDER — OSELTAMIVIR 75 MG CAPSULE
75.0000 mg | ORAL_CAPSULE | Freq: Every day | ORAL | 0 refills | Status: DC
Start: 2024-01-25 — End: 2024-01-31

## 2024-01-31 ENCOUNTER — Ambulatory Visit (INDEPENDENT_AMBULATORY_CARE_PROVIDER_SITE_OTHER)
Admission: RE | Admit: 2024-01-31 | Discharge: 2024-01-31 | Disposition: A | Discharge status: Home / Self Care | Payer: Self-pay | Source: Ambulatory Visit | Attending: NURSE PRACTITIONER

## 2024-01-31 ENCOUNTER — Encounter (INDEPENDENT_AMBULATORY_CARE_PROVIDER_SITE_OTHER): Payer: Self-pay | Admitting: NURSE PRACTITIONER

## 2024-01-31 ENCOUNTER — Ambulatory Visit: Payer: Self-pay | Attending: NURSE PRACTITIONER | Admitting: NURSE PRACTITIONER

## 2024-01-31 ENCOUNTER — Other Ambulatory Visit: Payer: Self-pay

## 2024-01-31 VITALS — BP 116/59 | HR 86 | Temp 98.9°F | Ht 63.0 in | Wt 131.3 lb

## 2024-01-31 DIAGNOSIS — Z09 Encounter for follow-up examination after completed treatment for conditions other than malignant neoplasm: Secondary | ICD-10-CM | POA: Insufficient documentation

## 2024-01-31 DIAGNOSIS — Z86000 Personal history of in-situ neoplasm of breast: Secondary | ICD-10-CM | POA: Insufficient documentation

## 2024-01-31 DIAGNOSIS — D0511 Intraductal carcinoma in situ of right breast: Secondary | ICD-10-CM

## 2024-01-31 DIAGNOSIS — D508 Other iron deficiency anemias: Secondary | ICD-10-CM

## 2024-01-31 LAB — COMPREHENSIVE METABOLIC PANEL, NON-FASTING
ALBUMIN/GLOBULIN RATIO: 2 — ABNORMAL HIGH (ref 0.8–1.4)
ALBUMIN: 4.7 g/dL (ref 3.5–5.7)
ALKALINE PHOSPHATASE: 52 U/L (ref 34–104)
ALT (SGPT): 13 U/L (ref 7–52)
ANION GAP: 5 mmol/L (ref 4–13)
AST (SGOT): 17 U/L (ref 13–39)
BILIRUBIN TOTAL: 0.4 mg/dL (ref 0.3–1.0)
BUN/CREA RATIO: 17 (ref 6–22)
BUN: 14 mg/dL (ref 7–25)
CALCIUM, CORRECTED: 9.9 mg/dL (ref 8.9–10.8)
CALCIUM: 10.5 mg/dL — ABNORMAL HIGH (ref 8.6–10.3)
CHLORIDE: 107 mmol/L (ref 98–107)
CO2 TOTAL: 27 mmol/L (ref 21–31)
CREATININE: 0.83 mg/dL (ref 0.60–1.30)
ESTIMATED GFR: 75 mL/min/{1.73_m2} (ref >59)
GLOBULIN: 2.3 (ref 2.0–3.5)
GLUCOSE: 93 mg/dL (ref 74–109)
OSMOLALITY, CALCULATED: 278 mosm/kg (ref 270–290)
POTASSIUM: 4.3 mmol/L (ref 3.5–5.1)
PROTEIN TOTAL: 7 g/dL (ref 6.4–8.9)
SODIUM: 139 mmol/L (ref 136–145)

## 2024-01-31 LAB — FERRITIN: FERRITIN: 87 ng/mL (ref 11–307)

## 2024-01-31 LAB — IRON TRANSFERRIN AND TIBC
IRON (TRANSFERRIN) SATURATION: 24 % (ref 15–50)
IRON: 96 ug/dL (ref 50–212)
TOTAL IRON BINDING CAPACITY: 392 ug/dL (ref 250–450)
TRANSFERRIN: 280 mg/dL (ref 203–362)
UIBC: 296 ug/dL (ref 130–375)

## 2024-01-31 LAB — CBC WITH DIFF
BASOPHIL #: 0.1 10*3/uL (ref 0.00–0.10)
BASOPHIL %: 1 % (ref 0–1)
EOSINOPHIL #: 0.1 10*3/uL (ref 0.00–0.50)
EOSINOPHIL %: 2 % (ref 1–7)
HCT: 40.9 % (ref 31.2–41.9)
HGB: 13.9 g/dL (ref 10.9–14.3)
LYMPHOCYTE #: 1.5 10*3/uL (ref 1.10–3.10)
LYMPHOCYTE %: 24 % (ref 16–46)
MCH: 28.9 pg (ref 24.7–32.8)
MCHC: 34 g/dL (ref 32.3–35.6)
MCV: 85 fL (ref 75.5–95.3)
MONOCYTE #: 0.6 10*3/uL (ref 0.20–0.90)
MONOCYTE %: 10 % (ref 4–11)
MPV: 8.3 fL (ref 7.9–10.8)
NEUTROPHIL #: 3.8 10*3/uL (ref 1.90–8.20)
NEUTROPHIL %: 63 % (ref 43–77)
PLATELETS: 343 10*3/uL (ref 140–440)
RBC: 4.81 10*6/uL (ref 3.63–4.92)
RDW: 13 % (ref 12.3–17.7)
WBC: 6.1 10*3/uL (ref 3.8–11.8)

## 2024-01-31 NOTE — Cancer Center Note (Signed)
 Katherine [name]  [id number]  28-Nov-1950   01/31/2024       Department of Hematology/Oncology  Return Patient Visit           REFERRING PROVIDER:  Henderson Baltimore, FNP-BC  [address]  [place],  [place] [address]      REASON FOR OFFICE VISIT:  Ongoing management and evaluation of  Right DCIS and iron deficiency anemia.      HISTORY OF PRESENT ILLNESS:  Katherine [name] is a 73 y.o. female who presents to today alone for evaluation of DCIS and Iron deficiency.    Patient completed her last of 2 Injectafer treatments on 05/24/23.   Patient reports that she is doing well and is without complaints.    She denies any PCIA.  She reports she still has fatigue but it is some better than before.     Initial Diagnosis:  04/1998-  Right breast Paget's Disease with DCIS   02/08/2000 Right breast Paget's disease recurrence DCIS    Surgeries:  6/28/199 Right breast segmental resection with positive margins.   06/1998  Right breast wide marginal incision with questionable focus of residual DCIS  02/23/2000 right breast mastectomy    Treatment History:  Tamoxifen 10 mg PO BID for 5 years.   Radiation therapy 5040 cGy in 28 fractions over six weeks.     REVIEW OF SYSTEMS:  Review of Systems   Constitutional:  Negative for chills, fatigue and fever.        Denies PICA   HENT:  Negative.     Eyes: Negative.    Respiratory:  Negative for cough and shortness of breath.    Cardiovascular: Negative.  Negative for chest pain.   Gastrointestinal: Negative.  Negative for blood in stool, diarrhea, nausea and vomiting.   Endocrine: Negative.    Genitourinary: Negative.  Negative for difficulty urinating.    Musculoskeletal: Negative.  Negative for arthralgias and back pain.   Skin: Negative.    Neurological: Negative.  Negative for dizziness and headaches.   Hematological: Negative.  Negative for adenopathy. Does not bruise/bleed easily.   Psychiatric/Behavioral: Negative.          Past Medical History:   Diagnosis Date    Anemia     B12 deficiency      Esophageal reflux     Essential hypertension     Hx of breast cancer     Hx of transfusion     Hypercholesterolemia     Osteoporosis     Thyroid disease     Vitamin D deficiency            Past Surgical History:   Procedure Laterality Date    HX BREAST LUMPECTOMY      HX COLONOSCOPY      HX RADICAL MASTECTOMY      HX TUBAL LIGATION             Social History     Socioeconomic History    Marital status: Married     Spouse name: Not on file    Number of children: Not on file    Years of education: Not on file    Highest education level: Not on file   Occupational History    Not on file   Tobacco Use    Smoking status: Former     Types: Cigarettes    Smokeless tobacco: Never   Vaping Use    Vaping status: Never Used   Substance and Sexual  Activity    Alcohol use: Never    Drug use: Never    Sexual activity: Not on file   Other Topics Concern    Not on file   Social History Narrative    Not on file     Social Determinants of Health     Financial Resource Strain: Low Risk  (09/27/2022)    Financial Resource Strain     SDOH Financial: No   Transportation Needs: Low Risk  (09/27/2022)    Transportation Needs     SDOH Transportation: No   Social Connections: Low Risk  (09/27/2022)    Social Connections     SDOH Social Isolation: 5 or more times a week   Intimate Partner Violence: High Risk (09/27/2022)    Intimate Partner Violence     SDOH Domestic Violence: No   Housing Stability: Low Risk  (09/27/2022)    Housing Stability     SDOH Housing Situation: I have housing.     SDOH Housing Worry: No       Social History     Social History Narrative    Not on file       Social History     Substance and Sexual Activity   Drug Use Never       Family Medical History:       Problem Relation (Age of Onset)    Blood Clots Daughter    Hypertension (High Blood Pressure) Mother    Lung Cancer Father    Thyroid Disease Mother              Current Outpatient Medications   Medication Sig    cyanocobalamin (VITAMIN B12) 2,500 mcg Sublingual  Tablet, Sublingual Place 1 Tablet (2,500 mcg total) under the tongue Daily    ergocalciferol, vitamin D2, (DRISDOL) 1,250 mcg (50,000 unit) Oral Capsule Take 1 Capsule (50,000 Units total) by mouth Every 7 days    lactobacillus rhamnosus, GG, (CULTURELLE) 10 billion cell Oral Capsule Take 1 Capsule by mouth Daily    levothyroxine (SYNTHROID) 75 mcg Oral Tablet Take 1 Tablet (75 mcg total) by mouth Every morning    lisinopriL (PRINIVIL) 10 mg Oral Tablet Take 1.5 Tablets (15 mg total) by mouth Once a day Takes 1.5 tables  so she has been doing 15 mg. (Patient taking differently: Take 1 Tablet (10 mg total) by mouth Daily Takes 1.5 tables  so she has been doing 15 mg.)    montelukast (SINGULAIR) 10 mg Oral Tablet Take 1 Tablet (10 mg total) by mouth Every evening       Allergies   Allergen Reactions    Boniva [Ibandronate]     Lipitor [Atorvastatin]          PHYSICAL EXAM:  BP (!) 116/59 (Site: Left Arm, Patient Position: Sitting, Cuff Size: Adult)   Pulse 86   Temp 37.2 C (98.9 F) (Temporal)   Ht 1.6 m (5\' 3" )   Wt 59.6 kg (131 lb 4.8 oz)   SpO2 99%   BMI 23.26 kg/m        ECOG Status: (0) Fully active, able to carry on all predisease performance without restriction   Physical Exam  Constitutional:       Appearance: Normal appearance.   Cardiovascular:      Rate and Rhythm: Normal rate and regular rhythm.      Pulses: Normal pulses.      Heart sounds: Normal heart sounds.   Pulmonary:      Effort:  Pulmonary effort is normal.      Breath sounds: Normal breath sounds.   Abdominal:      Palpations: Abdomen is soft.   Lymphadenopathy:      Head:      Right side of head: No submental, submandibular, tonsillar, preauricular, posterior auricular or occipital adenopathy.      Left side of head: No submental, submandibular, tonsillar, preauricular, posterior auricular or occipital adenopathy.      Cervical: No cervical adenopathy.      Right cervical: No superficial, deep or posterior cervical adenopathy.     Left  cervical: No superficial, deep or posterior cervical adenopathy.      Upper Body:      Right upper body: No supraclavicular or axillary adenopathy.      Left upper body: No supraclavicular or axillary adenopathy.   Skin:     General: Skin is warm and dry.   Neurological:      General: No focal deficit present.      Mental Status: She is alert and oriented to person, place, and time. Mental status is at baseline.         Radiology:      LABS:         CBC  Diff   Lab Results   Component Value Date/Time    WBC 6.1 01/31/2024 01:02 PM    HGB 13.9 01/31/2024 01:02 PM    HCT 40.9 01/31/2024 01:02 PM    PLTCNT 343 01/31/2024 01:02 PM    RBC 4.81 01/31/2024 01:02 PM    MCV 85.0 01/31/2024 01:02 PM    MCHC 34.0 01/31/2024 01:02 PM    MCH 28.9 01/31/2024 01:02 PM    RDW 13.0 01/31/2024 01:02 PM    MPV 8.3 01/31/2024 01:02 PM    Lab Results   Component Value Date/Time    PMNS 63 01/31/2024 01:02 PM    LYMPHOCYTES 24 01/31/2024 01:02 PM    EOSINOPHIL 2 01/31/2024 01:02 PM    MONOCYTES 10 01/31/2024 01:02 PM    BASOPHILS 1 01/31/2024 01:02 PM    BASOPHILS 0.10 01/31/2024 01:02 PM    PMNABS 3.80 01/31/2024 01:02 PM    LYMPHSABS 1.50 01/31/2024 01:02 PM    EOSABS 0.10 01/31/2024 01:02 PM    MONOSABS 0.60 01/31/2024 01:02 PM            Comprehensive Metabolic Profile    Lab Results   Component Value Date    SODIUM 139 01/31/2024    POTASSIUM 4.3 01/31/2024    CHLORIDE 107 01/31/2024    CO2 27 01/31/2024    ANIONGAP 5 01/31/2024    BUN 14 01/31/2024    CREATININE 0.83 01/31/2024    ALBUMIN 4.7 01/31/2024    CALCIUM 10.5 (H) 01/31/2024    GLUCOSENF 93 01/31/2024    ALKPHOS 52 01/31/2024    ALT 13 01/31/2024    AST 17 01/31/2024    TOTBILIRUBIN 0.4 01/31/2024    TOTALPROTEIN 7.0 01/31/2024              IRON   Date Value Ref Range Status   01/31/2024 96 50 - 212 ug/dL Final     FERRITIN   Date Value Ref Range Status   11/29/2023 170 11 - 307 ng/mL Final     TOTAL IRON BINDING CAPACITY   Date Value Ref Range Status   01/31/2024 392 250  - 450 ug/dL Final     UIBC   Date Value Ref Range Status  01/31/2024 296 130 - 375 ug/dL Final     IRON (TRANSFERRIN) SATURATION   Date Value Ref Range Status   01/31/2024 24 15 - 50 % Final                  ASSESSMENT:    ICD-10-CM    1. Iron deficiency anemia secondary to inadequate dietary iron intake  D50.8 COMPREHENSIVE METABOLIC PANEL, NON-FASTING     IRON TRANSFERRIN AND TIBC     FERRITIN     CBC/DIFF      2. Ductal carcinoma in situ (DCIS) of right breast  D05.11 MAMMO BILATERAL SCREENING-ADDL VIEWS/BREAST US AS REQ BY RAD                   PLAN:   1. Iron-deficiency anemia:  replaced at this time.  Asymptomatic at this time.   Labs completed today.     HGB stable  Iron with normal limits and iron saturation normal.   2.  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  She continues to have yearly mammograms as scheduled.  Mammogram scheduled at [facility] on 12/15/23 at 130 pm. Patient aware and was given order.   3. Former smoker of approximately 20 pack years.   Low dose CT was cancelled as they have not updated to the       Return in about 2 months (around 04/01/2024).     Katherine [name] was given the chance to ask questions, and these were answered to their satisfaction. The patient is welcome to call with any questions or concerns in the meantime.       Marvene Staff APRN, FNP-BC, AOCNP, 01/31/2024 , 13:37     You can see your note(s) in [facility]. It is common for you to encounter certain medical terminology which may be unfamiliar to you. You might see results before your provider does so please give at least 2 business days for review. Please have this understanding, that NOT all abnormal results are significant. Our office will contact you for any urgent or emergent action if necessary. If you have any questions or concerns, feel free to send a MyChart message or call the office. Please call with any new or concerning symptoms.       This  note was partially generated using MModal Fluency Direct system, and there may be some incorrect words, spellings, and punctuation that were not noted in checking the note before saving.     CC:  Macario Golds, FNP-C  [address]  [place] [address]

## 2024-02-05 ENCOUNTER — Other Ambulatory Visit: Payer: Self-pay

## 2024-02-05 ENCOUNTER — Encounter (INDEPENDENT_AMBULATORY_CARE_PROVIDER_SITE_OTHER): Payer: Self-pay | Admitting: NURSE PRACTITIONER

## 2024-02-05 ENCOUNTER — Ambulatory Visit (INDEPENDENT_AMBULATORY_CARE_PROVIDER_SITE_OTHER): Payer: Self-pay | Admitting: NURSE PRACTITIONER

## 2024-02-05 VITALS — BP 115/67 | HR 71 | Resp 16 | Ht 62.99 in | Wt 132.0 lb

## 2024-02-05 DIAGNOSIS — Z87891 Personal history of nicotine dependence: Secondary | ICD-10-CM

## 2024-02-05 DIAGNOSIS — E039 Hypothyroidism, unspecified: Secondary | ICD-10-CM

## 2024-02-05 DIAGNOSIS — E559 Vitamin D deficiency, unspecified: Secondary | ICD-10-CM

## 2024-02-05 DIAGNOSIS — I10 Essential (primary) hypertension: Secondary | ICD-10-CM

## 2024-02-05 DIAGNOSIS — J309 Allergic rhinitis, unspecified: Secondary | ICD-10-CM | POA: Insufficient documentation

## 2024-02-05 MED ORDER — LISINOPRIL 10 MG TABLET
10.0000 mg | ORAL_TABLET | Freq: Every day | ORAL | 1 refills | Status: AC
Start: 2024-02-05 — End: ?

## 2024-02-05 NOTE — Assessment & Plan Note (Signed)
 Condition stable will continue current therapy with vitamin d replacement.

## 2024-02-05 NOTE — Assessment & Plan Note (Signed)
 Routine monitoring labs ordered, will evaluate for any significant abnormalities and discuss changes with patient to current management as necessary based on results.     Condition stable will continue current therapy with Singulair .

## 2024-02-05 NOTE — Assessment & Plan Note (Signed)
 Current condition stable. Prescription sent in for patient to take Lisinopril  10 mg. Patient to continue monitoring blood pressure at home.

## 2024-02-05 NOTE — Assessment & Plan Note (Signed)
 Routine monitoring labs ordered, will evaluate for any significant abnormalities and discuss changes with patient to current management as necessary based on results.     Condition stable will continue current therapy with synthroid.

## 2024-02-05 NOTE — Progress Notes (Addendum)
 INTERNAL [facility], [facility]  [address]  [place] [address]       Name: Katherine [name] MRN:  [id number]   Date: 02/05/2024 Age: 73 y.o.       Chief Complaint:    Chief Complaint   Patient presents with    Follow Up 6 Months        HPI:  Katherine [name] is a 73 y.o. female who comes in today for routine follow up, chronic care management. Patient informs that overall she is doing well.     Patient was taking a pill and a half of her Lisinopril , but she has lost weight and noticed that her blood pressure was lower. She has since only started taking Lisinopril  10 mg, and needs a refill for this. She states that she checks her blood pressure every couple of days and her blood pressure has remained within normal limits.       No other concerns or complaints at this time.     ROS10+ reviewed and is negative except as mentioned above.       Past Medical History:  Past Medical History:   Diagnosis Date    Anemia     B12 deficiency     Esophageal reflux     Essential hypertension     Hx of breast cancer     Hx of transfusion     Hypercholesterolemia     Osteoporosis     Thyroid  disease     Vitamin D  deficiency          Past Surgical History:   Procedure Laterality Date    HX BREAST LUMPECTOMY      HX COLONOSCOPY      HX RADICAL MASTECTOMY      HX TUBAL LIGATION        Current Outpatient Medications   Medication Sig    cyanocobalamin (VITAMIN B12) 2,500 mcg Sublingual Tablet, Sublingual Place 1 Tablet (2,500 mcg total) under the tongue Daily    ergocalciferol , vitamin D2, (DRISDOL ) 1,250 mcg (50,000 unit) Oral Capsule Take 1 Capsule (50,000 Units total) by mouth Every 7 days    lactobacillus rhamnosus, GG, (CULTURELLE) 10 billion cell Oral Capsule Take 1 Capsule by mouth Daily    levothyroxine  (SYNTHROID) 75 mcg Oral Tablet Take 1 Tablet (75 mcg total) by mouth Every morning    lisinopriL  (PRINIVIL ) 10 mg Oral Tablet Take 1 Tablet (10 mg total) by mouth Daily Indications: high blood pressure     montelukast  (SINGULAIR ) 10 mg Oral Tablet Take 1 Tablet (10 mg total) by mouth Every evening     Allergies   Allergen Reactions    Boniva [Ibandronate]     Lipitor [Atorvastatin]        Family History:  Family Medical History:       Problem Relation (Age of Onset)    Blood Clots Daughter    Hypertension (High Blood Pressure) Mother    Lung Cancer Father    Thyroid  Disease Mother              Social History:   Social History     Tobacco Use   Smoking Status Former    Types: Cigarettes   Smokeless Tobacco Never     Social History     Substance and Sexual Activity   Alcohol Use Never     Social History     Occupational History    Not on file  Review of Systems:  Review of systems as discussed in HPI    Problem List:  Patient Active Problem List   Diagnosis    Ductal carcinoma in situ (DCIS) of right breast    Iron  deficiency anemia secondary to inadequate dietary iron  intake    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     Appearance: Normal appearance. She is normal weight. She is not ill-appearing, toxic-appearing or diaphoretic.   HENT:      Head: Normocephalic and atraumatic.      Right Ear: Tympanic membrane normal. There is no impacted cerumen.      Left Ear: Tympanic membrane normal. There is no impacted cerumen.      Nose: Nose normal. No rhinorrhea.      Mouth/Throat:      Mouth: Mucous membranes are moist.      Pharynx: Oropharynx is clear. No oropharyngeal exudate or posterior oropharyngeal erythema.   Eyes:      Extraocular Movements: Extraocular movements intact.       Conjunctiva/sclera: Conjunctivae normal.      Pupils: Pupils are equal, round, and reactive to light.   Neck:      Vascular: No carotid bruit.   Cardiovascular:      Rate and Rhythm: Normal rate and regular rhythm.   Pulmonary:      Effort: Pulmonary effort is normal. No respiratory distress.      Breath sounds: Normal breath sounds. No stridor. No wheezing, rhonchi or rales.   Chest:      Chest wall: No tenderness.   Abdominal:      General: Abdomen is flat. Bowel sounds are normal. There is no distension.      Palpations: Abdomen is soft. There is no mass.      Tenderness: There is no abdominal tenderness. There is no right CVA tenderness, left CVA tenderness, guarding or rebound.      Hernia: No hernia is present.   Musculoskeletal:         General: No swelling, tenderness, deformity or signs of injury. Normal range of motion.      Cervical back: Normal range of motion and neck supple. No rigidity or tenderness.      Right lower leg: No edema.      Left lower leg: No edema.   Lymphadenopathy:      Cervical: No cervical adenopathy.   Skin:     Capillary Refill: Capillary refill takes less than 2 seconds.   Neurological:      General: No focal deficit present.      Mental Status: She is alert and oriented to person, place, and time.      Cranial Nerves: No cranial nerve deficit.      Sensory: No sensory deficit.      Motor: No weakness.      Coordination: Coordination normal.      Gait: Gait  normal.      Deep Tendon Reflexes: Reflexes normal.   Psychiatric:         Mood and Affect: Mood normal.         Behavior: Behavior normal.        Data Reviewed:  Hospital Outpatient Visit on 01/31/2024   Component Date Value Ref Range Status    SODIUM 01/31/2024 139  136 - 145 mmol/L Final    POTASSIUM 01/31/2024 4.3  3.5 - 5.1 mmol/L Final    CHLORIDE 01/31/2024 107  98 - 107 mmol/L Final    CO2 TOTAL 01/31/2024 27  21 - 31 mmol/L Final    ANION GAP 01/31/2024 5  4 - 13 mmol/L Final    BUN 01/31/2024 14  7 - 25 mg/dL Final     CREATININE 29/52/8413 0.83  0.60 - 1.30 mg/dL Final    BUN/CREA RATIO 01/31/2024 17  6 - 22 Final    ESTIMATED GFR 01/31/2024 75  >59 mL/min/1.93m^2 Final    ALBUMIN 01/31/2024 4.7  3.5 - 5.7 g/dL Final    CALCIUM 24/40/1027 10.5 (H)  8.6 - 10.3 mg/dL Final    GLUCOSE 25/36/6440 93  74 - 109 mg/dL Final    ALKALINE PHOSPHATASE 01/31/2024 52  34 - 104 U/L Final    ALT (SGPT) 01/31/2024 13  7 - 52 U/L Final    AST (SGOT) 01/31/2024 17  13 - 39 U/L Final    BILIRUBIN TOTAL 01/31/2024 0.4  0.3 - 1.0 mg/dL Final    PROTEIN TOTAL 01/31/2024 7.0  6.4 - 8.9 g/dL Final    ALBUMIN/GLOBULIN RATIO 01/31/2024 2.0 (H)  0.8 - 1.4 Final    OSMOLALITY, CALCULATED 01/31/2024 278  270 - 290 mOsm/kg Final    CALCIUM, CORRECTED 01/31/2024 9.9  8.9 - 10.8 mg/dL Final    GLOBULIN 34/74/2595 2.3  2.0 - 3.5 Final    TOTAL IRON  BINDING CAPACITY 01/31/2024 392  250 - 450 ug/dL Final    IRON  (TRANSFERRIN) SATURATION 01/31/2024 24  15 - 50 % Final    IRON  01/31/2024 96  50 - 212 ug/dL Final    TRANSFERRIN 63/87/5643 280  203 - 362 mg/dL Final    UIBC 32/95/1884 296  130 - 375 ug/dL Final    FERRITIN 16/60/6301 87  11 - 307 ng/mL Final    WBC 01/31/2024 6.1  3.8 - 11.8 x10^3/uL Final    RBC 01/31/2024 4.81  3.63 - 4.92 x10^6/uL Final    HGB 01/31/2024 13.9  10.9 - 14.3 g/dL Final    HCT 60/08/9322 40.9  31.2 - 41.9 % Final    MCV 01/31/2024 85.0  75.5 - 95.3 fL Final    MCH 01/31/2024 28.9  24.7 - 32.8 pg Final    MCHC 01/31/2024 34.0  32.3 - 35.6 g/dL Final    RDW 55/73/2202 13.0  12.3 - 17.7 % Final    PLATELETS 01/31/2024 343  140 - 440 x10^3/uL Final    MPV 01/31/2024 8.3  7.9 - 10.8 fL Final    NEUTROPHIL % 01/31/2024 63  43 - 77 % Final    LYMPHOCYTE % 01/31/2024 24  16 - 46 % Final    MONOCYTE % 01/31/2024 10  4 - 11 % Final    EOSINOPHIL % 01/31/2024 2  1 - 7 % Final    BASOPHIL % 01/31/2024 1  0 - 1 % Final    NEUTROPHIL # 01/31/2024 3.80  1.90 - 8.20 x10^3/uL Final  LYMPHOCYTE # 01/31/2024 1.50  1.10 - 3.10 x10^3/uL Final    MONOCYTE  # 01/31/2024 0.60  0.20 - 0.90 x10^3/uL Final    EOSINOPHIL # 01/31/2024 0.10  0.00 - 0.50 x10^3/uL Final    BASOPHIL # 01/31/2024 0.10  0.00 - 0.10 x10^3/uL Final   Hospital Outpatient Visit on 11/29/2023   Component Date Value Ref Range Status    SODIUM 11/29/2023 138  136 - 145 mmol/L Final    POTASSIUM 11/29/2023 5.0  3.5 - 5.1 mmol/L Final    CHLORIDE 11/29/2023 103  98 - 107 mmol/L Final    CO2 TOTAL 11/29/2023 27  21 - 31 mmol/L Final    ANION GAP 11/29/2023 8  4 - 13 mmol/L Final    BUN 11/29/2023 15  7 - 25 mg/dL Final    CREATININE 16/08/9603 0.93  0.60 - 1.30 mg/dL Final    BUN/CREA RATIO 11/29/2023 16  6 - 22 Final    ESTIMATED GFR 11/29/2023 65  >59 mL/min/1.77m^2 Final    ALBUMIN 11/29/2023 4.7  3.5 - 5.7 g/dL Final    CALCIUM 54/07/8118 10.5 (H)  8.6 - 10.3 mg/dL Final    GLUCOSE 14/78/2956 81  74 - 109 mg/dL Final    ALKALINE PHOSPHATASE 11/29/2023 59  34 - 104 U/L Final    ALT (SGPT) 11/29/2023 14  7 - 52 U/L Final    AST (SGOT) 11/29/2023 21  13 - 39 U/L Final    BILIRUBIN TOTAL 11/29/2023 0.3  0.3 - 1.0 mg/dL Final    PROTEIN TOTAL 11/29/2023 7.6  6.4 - 8.9 g/dL Final    ALBUMIN/GLOBULIN RATIO 11/29/2023 1.6 (H)  0.8 - 1.4 Final    OSMOLALITY, CALCULATED 11/29/2023 276  270 - 290 mOsm/kg Final    CALCIUM, CORRECTED 11/29/2023 9.9  8.9 - 10.8 mg/dL Final    GLOBULIN 21/30/8657 2.9  2.0 - 3.5 Final    TOTAL IRON  BINDING CAPACITY 11/29/2023 396  250 - 450 ug/dL Final    IRON  (TRANSFERRIN) SATURATION 11/29/2023 21  15 - 50 % Final    IRON  11/29/2023 82  50 - 212 ug/dL Final    TRANSFERRIN 84/69/6295 283  203 - 362 mg/dL Final    UIBC 28/41/3244 314  130 - 375 ug/dL Final    FERRITIN 11/16/7251 170  11 - 307 ng/mL Final    WBC 11/29/2023 7.7  3.8 - 11.8 x10^3/uL Final    RBC 11/29/2023 4.99 (H)  3.63 - 4.92 x10^6/uL Final    HGB 11/29/2023 15.1 (H)  10.9 - 14.3 g/dL Final    HCT 66/44/0347 43.9 (H)  31.2 - 41.9 % Final    MCV 11/29/2023 88.0  75.5 - 95.3 fL Final    MCH 11/29/2023 30.3  24.7 - 32.8 pg  Final    MCHC 11/29/2023 34.5  32.3 - 35.6 g/dL Final    RDW 42/59/5638 12.4  12.3 - 17.7 % Final    PLATELETS 11/29/2023 383  140 - 440 x10^3/uL Final    MPV 11/29/2023 8.6  7.9 - 10.8 fL Final    NEUTROPHIL % 11/29/2023 60  43 - 77 % Final    LYMPHOCYTE % 11/29/2023 28  16 - 44 % Final    MONOCYTE % 11/29/2023 8  5 - 13 % Final    EOSINOPHIL % 11/29/2023 3  1 - 7 % Final    BASOPHIL % 11/29/2023 1  0 - 1 % Final    NEUTROPHIL # 11/29/2023 4.70  1.85 - 7.80 x10^3/uL Final    LYMPHOCYTE # 11/29/2023 2.10  1.00 - 3.00 x10^3/uL Final    MONOCYTE # 11/29/2023 0.70  0.30 - 1.00 x10^3/uL Final    EOSINOPHIL # 11/29/2023 0.20  0.00 - 0.50 x10^3/uL Final    BASOPHIL # 11/29/2023 0.10  0.00 - 0.10 x10^3/uL Final   Hospital Outpatient Visit on 08/23/2023   Component Date Value Ref Range Status    SODIUM 08/23/2023 139  136 - 145 mmol/L Final    POTASSIUM 08/23/2023 3.6  3.5 - 5.1 mmol/L Final    CHLORIDE 08/23/2023 108 (H)  98 - 107 mmol/L Final    CO2 TOTAL 08/23/2023 25  21 - 31 mmol/L Final    ANION GAP 08/23/2023 6  4 - 13 mmol/L Final    BUN 08/23/2023 12  7 - 25 mg/dL Final    CREATININE 09/81/1914 0.97  0.60 - 1.30 mg/dL Final    BUN/CREA RATIO 08/23/2023 12  6 - 22 Final    ESTIMATED GFR 08/23/2023 62  >59 mL/min/1.81m^2 Final    ALBUMIN 08/23/2023 4.6  3.5 - 5.7 g/dL Final    CALCIUM 78/29/5621 9.9  8.6 - 10.3 mg/dL Final    GLUCOSE 30/86/5784 81  74 - 109 mg/dL Final    ALKALINE PHOSPHATASE 08/23/2023 63  34 - 104 U/L Final    ALT (SGPT) 08/23/2023 15  7 - 52 U/L Final    AST (SGOT) 08/23/2023 19  13 - 39 U/L Final    BILIRUBIN TOTAL 08/23/2023 0.6  0.3 - 1.0 mg/dL Final    PROTEIN TOTAL 08/23/2023 7.5  6.4 - 8.9 g/dL Final    ALBUMIN/GLOBULIN RATIO 08/23/2023 1.6 (H)  0.8 - 1.4 Final    OSMOLALITY, CALCULATED 08/23/2023 276  270 - 290 mOsm/kg Final    CALCIUM, CORRECTED 08/23/2023 9.4  8.9 - 10.8 mg/dL Final    GLOBULIN 69/62/9528 2.9  2.9 - 5.4 Final    TOTAL IRON  BINDING CAPACITY 08/23/2023 346  250 - 450 ug/dL  Final    IRON  (TRANSFERRIN) SATURATION 08/23/2023 36  15 - 50 % Final    IRON  08/23/2023 124  50 - 212 ug/dL Final    TRANSFERRIN 41/32/4401 247  203 - 362 mg/dL Final    UIBC 02/72/5366 222  130 - 375 ug/dL Final    FERRITIN 44/01/4741 431 (H)  11 - 336 ng/mL Final    WBC 08/23/2023 6.0  3.8 - 11.8 x10^3/uL Final    RBC 08/23/2023 4.77  3.63 - 4.92 x10^6/uL Final    HGB 08/23/2023 14.1  10.9 - 14.3 g/dL Final    HCT 59/56/3875 40.7  31.2 - 41.9 % Final    MCV 08/23/2023 85.4  75.5 - 95.3 fL Final    MCH 08/23/2023 29.5  24.7 - 32.8 pg Final    MCHC 08/23/2023 34.6  32.3 - 35.6 g/dL Final    RDW 64/33/2951 14.9  12.3 - 17.7 % Final    PLATELETS 08/23/2023 284  140 - 440 x10^3/uL Final    MPV 08/23/2023 8.5  7.9 - 10.8 fL Final    NEUTROPHIL % 08/23/2023 63  43 - 77 % Final    LYMPHOCYTE % 08/23/2023 24  16 - 44 % Final    MONOCYTE % 08/23/2023 9  5 - 13 % Final    EOSINOPHIL % 08/23/2023 3  1 - 7 % Final    BASOPHIL % 08/23/2023 1  0 - 1 % Final  NEUTROPHIL # 08/23/2023 3.80  1.85 - 7.80 x10^3/uL Final    LYMPHOCYTE # 08/23/2023 1.40  1.00 - 3.00 x10^3/uL Final    MONOCYTE # 08/23/2023 0.60  0.30 - 1.00 x10^3/uL Final    EOSINOPHIL # 08/23/2023 0.20  0.00 - 0.50 x10^3/uL Final    BASOPHIL # 08/23/2023 0.10  0.00 - 0.10 x10^3/uL Final    TSH 08/23/2023 3.742  0.450 - 5.330 uIU/mL Final    CHOLESTEROL 08/23/2023 192  <200 mg/dL Final    TRIGLYCERIDES 08/23/2023 170 (H)  <=150 mg/dL Final    HDL CHOL 16/08/9603 58  >=40 mg/dL Final    LDL CALC 54/07/8118 100  0 - 100 mg/dL Final    VLDL CALC 14/78/2956 34  0 - 50 mg/dL Final    CHOL/HDL RATIO 08/23/2023 3.3   Final        Health Maintenance:  Health Maintenance   Topic Date Due    Hepatitis C screening  Never done    Shingles Vaccine (1 of 2) Never done    Medicare Annual Wellness Visit  Never done    Influenza Vaccine (1) Never done    Covid-19 Vaccine (3 - 2024-25 season) 07/16/2023    Fecal DNA Test (Cologuard)  10/20/2024    Mammography-Unilateral  12/07/2024     Depression Screening  02/04/2025    Adult Tdap-Td (2 - Td or Tdap) 11/14/2029    Osteoporosis screening  12/03/2031    Pneumococcal Vaccination, Age 75+  Completed        Assessment & Plan   Problem List Items Addressed This Visit          Cardiovascular System    Essential hypertension - Primary (Chronic)    Current condition stable. Prescription sent in for patient to take Lisinopril  10 mg. Patient to continue monitoring blood pressure at home.          Relevant Medications    lisinopriL  (PRINIVIL ) 10 mg Oral Tablet       Endocrine    Hypothyroidism (Chronic)    Routine monitoring labs ordered, will evaluate for any significant abnormalities and discuss changes with patient to current management as necessary based on results.     Condition stable will continue current therapy with synthroid             Vitamin D  deficiency (Chronic)    Condition stable will continue current therapy with vitamin d  replacement.               Ear, Nose, and Throat    Chronic allergic rhinitis (Chronic)    Routine monitoring labs ordered, will evaluate for any significant abnormalities and discuss changes with patient to current management as necessary based on results.     Condition stable will continue current therapy with Singulair .             Other Visit Diagnoses         Hypertension, unspecified type  (Chronic)       Relevant Medications    lisinopriL  (PRINIVIL ) 10 mg Oral Tablet             Patient recently had labs drawn and are within normal limits. UTD with preventative care screening. Mammogram scheduled for 02-12-2024.    Patient to continue current medication regimen.      Depression screening is negative. PHQ 2 Total: 0          Chronic disease management follow up. Patient is taking and tolerating all medications well without  complaint of negative side effects.   Past medical history/medications/labs/ and current plan of care summarized and discussed in detail with patient during visit.   Return to clinic with new or  worsening symptoms.         Follow up:  Return in about 6 months (around 08/07/2024), or if symptoms worsen or fail to improve.    This note was partially created using voice recognition software and is inherently subject to errors including those of syntax and "sound alike " substitutions which may escape proof reading.  In such instances, original meaning may be extrapolated by contextual derivation.    Chereign Kelsor, STUDENT NURSE PRACTITIONER  02/05/2024, 11:01

## 2024-02-07 ENCOUNTER — Other Ambulatory Visit (INDEPENDENT_AMBULATORY_CARE_PROVIDER_SITE_OTHER): Payer: Self-pay | Admitting: Internal Medicine

## 2024-03-28 ENCOUNTER — Other Ambulatory Visit (INDEPENDENT_AMBULATORY_CARE_PROVIDER_SITE_OTHER): Payer: Self-pay | Admitting: NURSE PRACTITIONER

## 2024-04-05 ENCOUNTER — Ambulatory Visit (INDEPENDENT_AMBULATORY_CARE_PROVIDER_SITE_OTHER)
Admission: RE | Admit: 2024-04-05 | Discharge: 2024-04-05 | Disposition: A | Discharge status: Home / Self Care | Payer: Self-pay | Source: Ambulatory Visit | Attending: NURSE PRACTITIONER | Admitting: NURSE PRACTITIONER

## 2024-04-05 ENCOUNTER — Encounter (INDEPENDENT_AMBULATORY_CARE_PROVIDER_SITE_OTHER): Payer: Self-pay | Admitting: NURSE PRACTITIONER

## 2024-04-05 ENCOUNTER — Ambulatory Visit: Payer: Self-pay | Attending: NURSE PRACTITIONER | Admitting: NURSE PRACTITIONER

## 2024-04-05 ENCOUNTER — Other Ambulatory Visit: Payer: Self-pay

## 2024-04-05 ENCOUNTER — Ambulatory Visit (INDEPENDENT_AMBULATORY_CARE_PROVIDER_SITE_OTHER): Payer: Self-pay | Admitting: NURSE PRACTITIONER

## 2024-04-05 VITALS — BP 129/63 | HR 72 | Temp 97.9°F | Ht 63.0 in | Wt 132.4 lb

## 2024-04-05 DIAGNOSIS — D0511 Intraductal carcinoma in situ of right breast: Secondary | ICD-10-CM

## 2024-04-05 DIAGNOSIS — Z09 Encounter for follow-up examination after completed treatment for conditions other than malignant neoplasm: Secondary | ICD-10-CM | POA: Insufficient documentation

## 2024-04-05 DIAGNOSIS — Z86000 Personal history of in-situ neoplasm of breast: Secondary | ICD-10-CM | POA: Insufficient documentation

## 2024-04-05 DIAGNOSIS — D508 Other iron deficiency anemias: Secondary | ICD-10-CM

## 2024-04-05 DIAGNOSIS — Z87891 Personal history of nicotine dependence: Secondary | ICD-10-CM | POA: Insufficient documentation

## 2024-04-05 DIAGNOSIS — C50011 Malignant neoplasm of nipple and areola, right female breast: Secondary | ICD-10-CM

## 2024-04-05 LAB — COMPREHENSIVE METABOLIC PANEL, NON-FASTING
ALBUMIN/GLOBULIN RATIO: 1.7 — ABNORMAL HIGH (ref 0.8–1.4)
ALBUMIN: 4.8 g/dL (ref 3.5–5.7)
ALKALINE PHOSPHATASE: 54 U/L (ref 34–104)
ALT (SGPT): 13 U/L (ref 7–52)
ANION GAP: 7 mmol/L (ref 4–13)
AST (SGOT): 20 U/L (ref 13–39)
BILIRUBIN TOTAL: 0.6 mg/dL (ref 0.3–1.0)
BUN/CREA RATIO: 19 (ref 6–22)
BUN: 18 mg/dL (ref 7–25)
CALCIUM, CORRECTED: 9.5 mg/dL (ref 8.9–10.8)
CALCIUM: 10.1 mg/dL (ref 8.6–10.3)
CHLORIDE: 107 mmol/L (ref 98–107)
CO2 TOTAL: 26 mmol/L (ref 21–31)
CREATININE: 0.94 mg/dL (ref 0.60–1.30)
ESTIMATED GFR: 64 mL/min/{1.73_m2} (ref >59)
GLOBULIN: 2.8 (ref 2.0–3.5)
GLUCOSE: 87 mg/dL (ref 74–109)
OSMOLALITY, CALCULATED: 281 mosm/kg (ref 270–290)
POTASSIUM: 4.4 mmol/L (ref 3.5–5.1)
PROTEIN TOTAL: 7.6 g/dL (ref 6.4–8.9)
SODIUM: 140 mmol/L (ref 136–145)

## 2024-04-05 LAB — CBC WITH DIFF
BASOPHIL #: 0.1 10*3/uL (ref 0.00–0.10)
BASOPHIL %: 1 % (ref 0–1)
EOSINOPHIL #: 0.2 10*3/uL (ref 0.00–0.50)
EOSINOPHIL %: 3 % (ref 1–7)
HCT: 42.9 % — ABNORMAL HIGH (ref 31.2–41.9)
HGB: 14.6 g/dL — ABNORMAL HIGH (ref 10.9–14.3)
LYMPHOCYTE #: 1.3 10*3/uL (ref 1.10–3.10)
LYMPHOCYTE %: 21 % (ref 16–46)
MCH: 28.9 pg (ref 24.7–32.8)
MCHC: 34.1 g/dL (ref 32.3–35.6)
MCV: 84.6 fL (ref 75.5–95.3)
MONOCYTE #: 0.4 10*3/uL (ref 0.20–0.90)
MONOCYTE %: 7 % (ref 4–11)
MPV: 8.4 fL (ref 7.9–10.8)
NEUTROPHIL #: 4.2 10*3/uL (ref 1.90–8.20)
NEUTROPHIL %: 69 % (ref 43–77)
PLATELETS: 319 10*3/uL (ref 140–440)
RBC: 5.07 10*6/uL — ABNORMAL HIGH (ref 3.63–4.92)
RDW: 13 % (ref 12.3–17.7)
WBC: 6.1 10*3/uL (ref 3.8–11.8)

## 2024-04-05 LAB — FERRITIN: FERRITIN: 40 ng/mL (ref 11–307)

## 2024-04-05 LAB — IRON TRANSFERRIN AND TIBC
IRON (TRANSFERRIN) SATURATION: 28 % (ref 15–50)
IRON: 117 ug/dL (ref 50–212)
TOTAL IRON BINDING CAPACITY: 424 ug/dL (ref 250–450)
TRANSFERRIN: 303 mg/dL (ref 203–362)
UIBC: 307 ug/dL (ref 130–375)

## 2024-04-05 NOTE — Cancer Center Note (Signed)
 Katherine [name]  [id number]  [birth year]   04/05/2024       Department of Hematology/Oncology  Return Patient Visit           REFERRING PROVIDER:  Barbie Lex, FNP-BC  [address]  [place],  [place] [address]      REASON FOR OFFICE VISIT:  Ongoing management and evaluation of  Right DCIS and iron  deficiency anemia.      HISTORY OF PRESENT ILLNESS:  Katherine [name] is a 73 y.o. female who presents to today alone for evaluation of DCIS and Iron  deficiency.    Patient completed her last of 2 Injectafer  treatments on 05/24/23.   Patient reports that she is doing well and is without complaints.    She denies any PCIA.  She reports she still has fatigue but it is some better than before.  Last mammogram was 3/25 and was benign.       Initial Diagnosis:  04/1998-  Right breast Paget's Disease with DCIS   02/08/2000 Right breast Paget's disease recurrence DCIS    Surgeries:  6/28/199 Right breast segmental resection with positive margins.   06/1998  Right breast wide marginal incision with questionable focus of residual DCIS  02/23/2000 right breast mastectomy    Treatment History:  Tamoxifen 10 mg PO BID for 5 years.   Radiation therapy 5040 cGy in 28 fractions over six weeks.     REVIEW OF SYSTEMS:  Review of Systems   Constitutional:  Negative for chills, fatigue and fever.        Denies PICA   HENT:  Negative.     Eyes: Negative.    Respiratory:  Negative for cough and shortness of breath.    Cardiovascular: Negative.  Negative for chest pain.   Gastrointestinal: Negative.  Negative for blood in stool, diarrhea, nausea and vomiting.   Endocrine: Negative.    Genitourinary: Negative.  Negative for difficulty urinating.    Musculoskeletal: Negative.  Negative for arthralgias and back pain.   Skin: Negative.    Neurological: Negative.  Negative for dizziness and headaches.   Hematological: Negative.  Negative for adenopathy. Does not bruise/bleed easily.   Psychiatric/Behavioral: Negative.          Past Medical History:   Diagnosis  Date    Anemia     B12 deficiency     Esophageal reflux     Essential hypertension     Hx of breast cancer     Hx of transfusion     Hypercholesterolemia     Osteoporosis     Thyroid  disease     Vitamin D  deficiency            Past Surgical History:   Procedure Laterality Date    HX BREAST LUMPECTOMY      HX COLONOSCOPY      HX RADICAL MASTECTOMY      HX TUBAL LIGATION             Social History     Socioeconomic History    Marital status: Married     Spouse name: Not on file    Number of children: Not on file    Years of education: Not on file    Highest education level: Not on file   Occupational History    Not on file   Tobacco Use    Smoking status: Former     Types: Cigarettes    Smokeless tobacco: Never   Vaping Use  Vaping status: Never Used   Substance and Sexual Activity    Alcohol use: Never    Drug use: Never    Sexual activity: Not on file   Other Topics Concern    Not on file   Social History Narrative    Not on file     Social Determinants of Health     Financial Resource Strain: Low Risk  (09/27/2022)    Financial Resource Strain     SDOH Financial: No   Transportation Needs: Low Risk  (09/27/2022)    Transportation Needs     SDOH Transportation: No   Social Connections: Low Risk  (09/27/2022)    Social Connections     SDOH Social Isolation: 5 or more times a week   Intimate Partner Violence: High Risk (09/27/2022)    Intimate Partner Violence     SDOH Domestic Violence: No   Housing Stability: Low Risk  (09/27/2022)    Housing Stability     SDOH Housing Situation: I have housing.     SDOH Housing Worry: No       Social History     Social History Narrative    Not on file       Social History     Substance and Sexual Activity   Drug Use Never       Family Medical History:       Problem Relation (Age of Onset)    Blood Clots Daughter    Hypertension (High Blood Pressure) Mother    Lung Cancer Father    Thyroid  Disease Mother              Current Outpatient Medications   Medication Sig     cyanocobalamin (VITAMIN B12) 2,500 mcg Sublingual Tablet, Sublingual Place 1 Tablet (2,500 mcg total) under the tongue Daily    ergocalciferol , vitamin D2, (DRISDOL ) 1,250 mcg (50,000 unit) Oral Capsule Take 1 capsule by mouth once a week    lactobacillus rhamnosus, GG, (CULTURELLE) 10 billion cell Oral Capsule Take 1 Capsule by mouth Daily    levothyroxine  (SYNTHROID) 75 mcg Oral Tablet Take 1 Tablet (75 mcg total) by mouth Every morning    lisinopriL  (PRINIVIL ) 10 mg Oral Tablet Take 1 Tablet (10 mg total) by mouth Daily Indications: high blood pressure    montelukast  (SINGULAIR ) 10 mg Oral Tablet Take 1 Tablet (10 mg total) by mouth Every evening       Allergies   Allergen Reactions    Boniva [Ibandronate]     Lipitor [Atorvastatin]          PHYSICAL EXAM:  BP 129/63 (Site: Left Arm, Patient Position: Sitting, Cuff Size: Adult)   Pulse 72   Temp 36.6 C (97.9 F) (Temporal)   Ht 1.6 m (5\' 3" )   Wt 60.1 kg (132 lb 6.4 oz)   SpO2 97%   BMI 23.45 kg/m        ECOG Status: (0) Fully active, able to carry on all predisease performance without restriction   Physical Exam  Constitutional:       Appearance: Normal appearance.   Cardiovascular:      Rate and Rhythm: Normal rate and regular rhythm.      Pulses: Normal pulses.      Heart sounds: Normal heart sounds.   Pulmonary:      Effort: Pulmonary effort is normal.      Breath sounds: Normal breath sounds.   Chest:   Breasts:     Left: No bleeding.  Comments: Declines breast exam  Abdominal:      Palpations: Abdomen is soft.   Lymphadenopathy:      Head:      Right side of head: No submental, submandibular, tonsillar, preauricular, posterior auricular or occipital adenopathy.      Left side of head: No submental, submandibular, tonsillar, preauricular, posterior auricular or occipital adenopathy.      Cervical: No cervical adenopathy.      Right cervical: No superficial, deep or posterior cervical adenopathy.     Left cervical: No superficial, deep or  posterior cervical adenopathy.      Upper Body:      Right upper body: No supraclavicular or axillary adenopathy.      Left upper body: No supraclavicular or axillary adenopathy.   Skin:     General: Skin is warm and dry.   Neurological:      General: No focal deficit present.      Mental Status: She is alert and oriented to person, place, and time. Mental status is at baseline.         Radiology:      LABS:   CBC  Diff   Lab Results   Component Value Date/Time    WBC 6.1 04/05/2024 09:31 AM    HGB 14.6 (H) 04/05/2024 09:31 AM    HCT 42.9 (H) 04/05/2024 09:31 AM    PLTCNT 319 04/05/2024 09:31 AM    RBC 5.07 (H) 04/05/2024 09:31 AM    MCV 84.6 04/05/2024 09:31 AM    MCHC 34.1 04/05/2024 09:31 AM    MCH 28.9 04/05/2024 09:31 AM    RDW 13.0 04/05/2024 09:31 AM    MPV 8.4 04/05/2024 09:31 AM    Lab Results   Component Value Date/Time    PMNS 69 04/05/2024 09:31 AM    LYMPHOCYTES 21 04/05/2024 09:31 AM    EOSINOPHIL 3 04/05/2024 09:31 AM    MONOCYTES 7 04/05/2024 09:31 AM    BASOPHILS 1 04/05/2024 09:31 AM    BASOPHILS 0.10 04/05/2024 09:31 AM    PMNABS 4.20 04/05/2024 09:31 AM    LYMPHSABS 1.30 04/05/2024 09:31 AM    EOSABS 0.20 04/05/2024 09:31 AM    MONOSABS 0.40 04/05/2024 09:31 AM            Comprehensive Metabolic Profile    Lab Results   Component Value Date    SODIUM 140 04/05/2024    POTASSIUM 4.4 04/05/2024    CHLORIDE 107 04/05/2024    CO2 26 04/05/2024    ANIONGAP 7 04/05/2024    BUN 18 04/05/2024    CREATININE 0.94 04/05/2024    ALBUMIN 4.8 04/05/2024    CALCIUM 10.1 04/05/2024    GLUCOSENF 87 04/05/2024    ALKPHOS 54 04/05/2024    ALT 13 04/05/2024    AST 20 04/05/2024    TOTBILIRUBIN 0.6 04/05/2024    TOTALPROTEIN 7.6 04/05/2024              IRON    Date Value Ref Range Status   04/05/2024 117 50 - 212 ug/dL Final     FERRITIN   Date Value Ref Range Status   01/31/2024 87 11 - 307 ng/mL Final     TOTAL IRON  BINDING CAPACITY   Date Value Ref Range Status   04/05/2024 424 250 - 450 ug/dL Final     UIBC   Date  Value Ref Range Status   04/05/2024 307 130 - 375 ug/dL Final     IRON  (TRANSFERRIN) SATURATION  Date Value Ref Range Status   04/05/2024 28 15 - 50 % Final                  ASSESSMENT:    ICD-10-CM    1. Iron  deficiency anemia secondary to inadequate dietary iron  intake  D50.8       2. Ductal carcinoma in situ (DCIS) of right breast  D05.11       3. 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 Iron -deficiency anemia:  replaced at this time.  Asymptomatic at this time.   Labs completed today.     HGB stable  Iron  with normal limits and iron  saturation normal.   2.  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  She continues to have yearly mammograms as scheduled.  Mammogram  due next year 3/26    3. Former smoker of approximately 20 pack years.   Low dose CT was cancelled as  CMS has  not updated to the recommendation of the [employer]       Return in about 3 months (around 07/06/2024).     Gertha D Kotter was given the chance to ask questions, and these were answered to their satisfaction. The patient is welcome to call with any questions or concerns in the meantime.       Angela Kell APRN, FNP-BC, AOCNP, 04/05/2024 , 10:24     You can see your note(s) in [facility]. It is common for you to encounter certain medical terminology which may be unfamiliar to you. You might see results before your provider does so please give at least 2 business days for review. Please have this understanding, that NOT all abnormal results are significant. Our office will contact you for any urgent or emergent action if necessary. If you have any questions or concerns, feel free to send a MyChart message or call the office. Please call with any new or concerning symptoms.       This note was partially generated using MModal Fluency Direct system, and there may be some incorrect words, spellings, and punctuation that were not  noted in checking the note before saving.     CC:  Randa Burton, FNP-C  [address]  [place] [address]

## 2024-04-05 NOTE — Result Encounter Note (Signed)
Iron and ferritin within normal limits.

## 2024-05-23 ENCOUNTER — Encounter (INDEPENDENT_AMBULATORY_CARE_PROVIDER_SITE_OTHER): Payer: Self-pay | Admitting: NURSE PRACTITIONER

## 2024-05-23 ENCOUNTER — Other Ambulatory Visit (INDEPENDENT_AMBULATORY_CARE_PROVIDER_SITE_OTHER): Payer: Self-pay | Admitting: NURSE PRACTITIONER

## 2024-05-23 DIAGNOSIS — K921 Melena: Secondary | ICD-10-CM

## 2024-05-23 DIAGNOSIS — D508 Other iron deficiency anemias: Secondary | ICD-10-CM

## 2024-05-24 ENCOUNTER — Other Ambulatory Visit: Payer: Self-pay

## 2024-05-24 ENCOUNTER — Ambulatory Visit: Attending: NURSE PRACTITIONER

## 2024-05-24 DIAGNOSIS — D508 Other iron deficiency anemias: Secondary | ICD-10-CM | POA: Insufficient documentation

## 2024-05-24 DIAGNOSIS — K921 Melena: Secondary | ICD-10-CM | POA: Insufficient documentation

## 2024-05-24 LAB — COMPREHENSIVE METABOLIC PANEL, NON-FASTING
ALBUMIN/GLOBULIN RATIO: 1.8 — ABNORMAL HIGH (ref 0.8–1.4)
ALBUMIN: 4.4 g/dL (ref 3.5–5.7)
ALKALINE PHOSPHATASE: 48 U/L (ref 34–104)
ALT (SGPT): 11 U/L (ref 7–52)
ANION GAP: 7 mmol/L (ref 4–13)
AST (SGOT): 17 U/L (ref 13–39)
BILIRUBIN TOTAL: 0.4 mg/dL (ref 0.3–1.0)
BUN/CREA RATIO: 21 (ref 6–22)
BUN: 19 mg/dL (ref 7–25)
CALCIUM, CORRECTED: 9.4 mg/dL (ref 8.9–10.8)
CALCIUM: 9.7 mg/dL (ref 8.6–10.3)
CHLORIDE: 108 mmol/L — ABNORMAL HIGH (ref 98–107)
CO2 TOTAL: 24 mmol/L (ref 21–31)
CREATININE: 0.92 mg/dL (ref 0.60–1.30)
ESTIMATED GFR: 66 mL/min/1.73mˆ2 (ref >59)
GLOBULIN: 2.5 (ref 2.0–3.5)
GLUCOSE: 101 mg/dL (ref 74–109)
OSMOLALITY, CALCULATED: 280 mosm/kg (ref 270–290)
POTASSIUM: 4.7 mmol/L (ref 3.5–5.1)
PROTEIN TOTAL: 6.9 g/dL (ref 6.4–8.9)
SODIUM: 139 mmol/L (ref 136–145)

## 2024-05-24 LAB — IRON TRANSFERRIN AND TIBC
IRON (TRANSFERRIN) SATURATION: 19 % (ref 15–50)
IRON: 82 ug/dL (ref 50–212)
TOTAL IRON BINDING CAPACITY: 442 ug/dL (ref 250–450)
TRANSFERRIN: 316 mg/dL (ref 203–362)
UIBC: 360 ug/dL (ref 130–375)

## 2024-05-24 LAB — CBC
HCT: 37.2 % (ref 31.2–41.9)
HGB: 12.6 g/dL (ref 10.9–14.3)
MCH: 28.4 pg (ref 24.7–32.8)
MCHC: 33.8 g/dL (ref 32.3–35.6)
MCV: 84.1 fL (ref 75.5–95.3)
MPV: 9.3 fL (ref 7.9–10.8)
PLATELETS: 346 x10ˆ3/uL (ref 140–440)
RBC: 4.42 x10ˆ6/uL (ref 3.63–4.92)
RDW: 13.3 % (ref 12.3–17.7)
WBC: 6.1 x10ˆ3/uL (ref 3.8–11.8)

## 2024-05-24 LAB — FERRITIN: FERRITIN: 13 ng/mL (ref 11–307)

## 2024-05-28 ENCOUNTER — Ambulatory Visit (INDEPENDENT_AMBULATORY_CARE_PROVIDER_SITE_OTHER): Payer: Self-pay | Admitting: NURSE PRACTITIONER

## 2024-07-10 ENCOUNTER — Other Ambulatory Visit (INDEPENDENT_AMBULATORY_CARE_PROVIDER_SITE_OTHER): Payer: Self-pay | Admitting: NURSE PRACTITIONER

## 2024-07-10 DIAGNOSIS — D508 Other iron deficiency anemias: Secondary | ICD-10-CM

## 2024-07-12 ENCOUNTER — Other Ambulatory Visit: Payer: Self-pay

## 2024-07-12 ENCOUNTER — Ambulatory Visit: Payer: Self-pay | Attending: NURSE PRACTITIONER | Admitting: NURSE PRACTITIONER

## 2024-07-12 ENCOUNTER — Ambulatory Visit (INDEPENDENT_AMBULATORY_CARE_PROVIDER_SITE_OTHER)
Admission: RE | Admit: 2024-07-12 | Discharge: 2024-07-12 | Disposition: A | Discharge status: Home / Self Care | Payer: Self-pay | Source: Ambulatory Visit | Attending: NURSE PRACTITIONER | Admitting: NURSE PRACTITIONER

## 2024-07-12 ENCOUNTER — Encounter (HOSPITAL_COMMUNITY): Payer: Self-pay | Admitting: NURSE PRACTITIONER

## 2024-07-12 ENCOUNTER — Encounter (INDEPENDENT_AMBULATORY_CARE_PROVIDER_SITE_OTHER): Payer: Self-pay | Admitting: NURSE PRACTITIONER

## 2024-07-12 VITALS — BP 124/70 | HR 88 | Temp 97.7°F | Ht 63.0 in | Wt 129.3 lb

## 2024-07-12 DIAGNOSIS — Z604 Social exclusion and rejection: Secondary | ICD-10-CM | POA: Insufficient documentation

## 2024-07-12 DIAGNOSIS — D508 Other iron deficiency anemias: Secondary | ICD-10-CM

## 2024-07-12 DIAGNOSIS — C50011 Malignant neoplasm of nipple and areola, right female breast: Secondary | ICD-10-CM | POA: Insufficient documentation

## 2024-07-12 DIAGNOSIS — D0511 Intraductal carcinoma in situ of right breast: Secondary | ICD-10-CM

## 2024-07-12 LAB — IRON TRANSFERRIN AND TIBC
IRON (TRANSFERRIN) SATURATION: 13 % — ABNORMAL LOW (ref 15–50)
IRON: 67 ug/dL (ref 50–212)
TOTAL IRON BINDING CAPACITY: 521 ug/dL — ABNORMAL HIGH (ref 250–450)
TRANSFERRIN: 372 mg/dL — ABNORMAL HIGH (ref 203–362)
UIBC: 454 ug/dL — ABNORMAL HIGH (ref 130–375)

## 2024-07-12 LAB — COMPREHENSIVE METABOLIC PANEL, NON-FASTING
ALBUMIN/GLOBULIN RATIO: 1.8 — ABNORMAL HIGH (ref 0.8–1.4)
ALBUMIN: 4.9 g/dL (ref 3.5–5.7)
ALKALINE PHOSPHATASE: 60 U/L (ref 34–104)
ALT (SGPT): 11 U/L (ref 7–52)
ANION GAP: 6 mmol/L (ref 4–13)
AST (SGOT): 19 U/L (ref 13–39)
BILIRUBIN TOTAL: 0.5 mg/dL (ref 0.3–1.0)
BUN/CREA RATIO: 13 (ref 6–22)
BUN: 13 mg/dL (ref 7–25)
CALCIUM, CORRECTED: 9.6 mg/dL (ref 8.9–10.8)
CALCIUM: 10.3 mg/dL (ref 8.6–10.3)
CHLORIDE: 106 mmol/L (ref 98–107)
CO2 TOTAL: 26 mmol/L (ref 21–31)
CREATININE: 0.99 mg/dL (ref 0.60–1.30)
ESTIMATED GFR: 60 mL/min/1.73mˆ2 (ref >59)
GLOBULIN: 2.8 (ref 2.0–3.5)
GLUCOSE: 98 mg/dL (ref 74–109)
OSMOLALITY, CALCULATED: 276 mosm/kg (ref 270–290)
POTASSIUM: 4.6 mmol/L (ref 3.5–5.1)
PROTEIN TOTAL: 7.7 g/dL (ref 6.4–8.9)
SODIUM: 138 mmol/L (ref 136–145)

## 2024-07-12 LAB — CBC
HCT: 39.9 % (ref 31.2–41.9)
HGB: 13.7 g/dL (ref 10.9–14.3)
MCH: 27 pg (ref 24.7–32.8)
MCHC: 34.3 g/dL (ref 32.3–35.6)
MCV: 78.8 fL (ref 75.5–95.3)
MPV: 8.2 fL (ref 7.9–10.8)
PLATELETS: 391 x10ˆ3/uL (ref 140–440)
RBC: 5.06 x10ˆ6/uL — ABNORMAL HIGH (ref 3.63–4.92)
RDW: 13.7 % (ref 12.3–17.7)
WBC: 7.8 x10ˆ3/uL (ref 3.8–11.8)

## 2024-07-12 LAB — FERRITIN: FERRITIN: 8 ng/mL — ABNORMAL LOW (ref 11–307)

## 2024-07-12 LAB — PHOSPHORUS: PHOSPHORUS: 4.4 mg/dL (ref 3.7–7.2)

## 2024-07-12 NOTE — Cancer Center Note (Signed)
 Katherine [name]  [id number]  [birth year]   07/12/2024       Department of Hematology/Oncology  Return Patient Visit           REFERRING PROVIDER:  Hershel Line, FNP-BC  [address]  [place],  [place] [address]    PCP:  Rosina Linsey, FNP-C  [address]  [place] [address]          REASON FOR OFFICE VISIT:  Ongoing management and evaluation of  Iron  deficiency anemia      HISTORY OF PRESENT ILLNESS:  History of Present Illness  Katherine [name] is a 73 year old female with iron  deficiency who presents for follow-up. She is accompanied by her daughter, Katherine [name], and her husband.    Constitutional symptoms  - Generalized malaise  - Fatigue present  - Describes overall condition as 'pretty good'    Gastrointestinal symptoms  - No cravings  - No nausea  - No vomiting  - No diarrhea  - No hematochezia or melena    Hematologic findings  - Iron  deficiency anemia  - Hemoglobin levels previously checked  - Awaiting further laboratory results to confirm current hematologic status          REVIEW OF SYSTEMS:    Past Medical History:   Diagnosis Date    Anemia     B12 deficiency     Esophageal reflux     Essential hypertension     Hx of breast cancer     Hx of transfusion     Hypercholesterolemia     Osteoporosis     Thyroid  disease     Vitamin D  deficiency            Past Surgical History:   Procedure Laterality Date    HX BREAST LUMPECTOMY      HX COLONOSCOPY      HX RADICAL MASTECTOMY      HX TUBAL LIGATION             Social History     Socioeconomic History    Marital status: Married     Spouse name: Not on file    Number of children: Not on file    Years of education: Not on file    Highest education level: Not on file   Occupational History    Not on file   Tobacco Use    Smoking status: Former     Types: Cigarettes    Smokeless tobacco: Never   Vaping Use    Vaping status: Never Used   Substance and Sexual Activity    Alcohol use: Never    Drug use: Never    Sexual activity: Not on file   Other Topics Concern    Not on  file   Social History Narrative    Not on file     Social Determinants of Health     Financial Resource Strain: Low Risk  (09/27/2022)    Financial Resource Strain     SDOH Financial: No   Transportation Needs: Low Risk  (09/27/2022)    Transportation Needs     SDOH Transportation: No   Social Connections: Low Risk  (09/27/2022)    Social Connections     SDOH Social Isolation: 5 or more times a week   Intimate Partner Violence: High Risk (09/27/2022)    Intimate Partner Violence     SDOH Domestic Violence: No   Housing Stability: Low Risk  (09/27/2022)  Housing Stability     SDOH Housing Situation: I have housing.     SDOH Housing Worry: No       Social History     Social History Narrative    Not on file       Social History     Substance and Sexual Activity   Drug Use Never       Family Medical History:       Problem Relation (Age of Onset)    Blood Clots Daughter    Hypertension (High Blood Pressure) Mother    Lung Cancer Father    Thyroid  Disease Mother              Current Outpatient Medications   Medication Sig    cyanocobalamin (VITAMIN B12) 2,500 mcg Sublingual Tablet, Sublingual Place 1 Tablet (2,500 mcg total) under the tongue Daily    ergocalciferol , vitamin D2, (DRISDOL ) 1,250 mcg (50,000 unit) Oral Capsule Take 1 capsule by mouth once a week    lactobacillus rhamnosus, GG, (CULTURELLE) 10 billion cell Oral Capsule Take 1 Capsule by mouth Daily    levothyroxine  (SYNTHROID) 75 mcg Oral Tablet Take 1 Tablet (75 mcg total) by mouth Every morning    lisinopriL  (PRINIVIL ) 10 mg Oral Tablet Take 1 Tablet (10 mg total) by mouth Daily Indications: high blood pressure    montelukast  (SINGULAIR ) 10 mg Oral Tablet Take 1 Tablet (10 mg total) by mouth Every evening       Allergies[1]      PHYSICAL EXAM:  BP 124/70 (Site: Left Arm, Patient Position: Sitting, Cuff Size: Adult)   Pulse 88   Temp 36.5 C (97.7 F) (Temporal)   Ht 1.6 m (5' 3)   Wt 58.7 kg (129 lb 4.8 oz)   BMI 22.90 kg/m        ECOG Status: (0)  Fully active, able to carry on all predisease performance without restriction   Physical Exam  GENERAL: Alert, cooperative, well developed, no acute distress  HEENT: Normocephalic, normal oropharynx, moist mucous membranes  CHEST: Clear to auscultation bilaterally, No wheezes, rhonchi, or crackles  CARDIOVASCULAR: Normal heart rate and rhythm, S1 and S2 normal without murmurs  ABDOMEN: Soft, non-tender, non-distended, without organomegaly, Normal bowel sounds  EXTREMITIES: No cyanosis or edema  NEUROLOGICAL: Cranial nerves grossly intact, Moves all extremities without gross motor or sensory deficit      LABS.    CBC  Diff   Lab Results   Component Value Date/Time    WBC 7.8 07/12/2024 10:23 AM    HGB 13.7 07/12/2024 10:23 AM    HCT 39.9 07/12/2024 10:23 AM    PLTCNT 391 07/12/2024 10:23 AM    RBC 5.06 (H) 07/12/2024 10:23 AM    MCV 78.8 07/12/2024 10:23 AM    MCHC 34.3 07/12/2024 10:23 AM    MCH 27.0 07/12/2024 10:23 AM    RDW 13.7 07/12/2024 10:23 AM    MPV 8.2 07/12/2024 10:23 AM    Lab Results   Component Value Date/Time    PMNS 69 04/05/2024 09:31 AM    LYMPHOCYTES 21 04/05/2024 09:31 AM    EOSINOPHIL 3 04/05/2024 09:31 AM    MONOCYTES 7 04/05/2024 09:31 AM    BASOPHILS 1 04/05/2024 09:31 AM    BASOPHILS 0.10 04/05/2024 09:31 AM    PMNABS 4.20 04/05/2024 09:31 AM    LYMPHSABS 1.30 04/05/2024 09:31 AM    EOSABS 0.20 04/05/2024 09:31 AM    MONOSABS 0.40 04/05/2024 09:31 AM  Comprehensive Metabolic Profile    Lab Results   Component Value Date    SODIUM 138 07/12/2024    POTASSIUM 4.6 07/12/2024    CHLORIDE 106 07/12/2024    CO2 26 07/12/2024    ANIONGAP 6 07/12/2024    BUN 13 07/12/2024    CREATININE 0.99 07/12/2024    ALBUMIN 4.9 07/12/2024    CALCIUM 10.3 07/12/2024    GLUCOSENF 98 07/12/2024    ALKPHOS 60 07/12/2024    ALT 11 07/12/2024    AST 19 07/12/2024    TOTBILIRUBIN 0.5 07/12/2024    TOTALPROTEIN 7.7 07/12/2024         ESTIMATED GFR   Date Value Ref Range Status   07/12/2024 60 >59  mL/min/1.2m^2 Final           IRON    Date Value Ref Range Status   07/12/2024 67 50 - 212 ug/dL Final     FERRITIN   Date Value Ref Range Status   05/24/2024 13 11 - 307 ng/mL Final     TOTAL IRON  BINDING CAPACITY   Date Value Ref Range Status   07/12/2024 521 (H) 250 - 450 ug/dL Final     UIBC   Date Value Ref Range Status   07/12/2024 454 (H) 130 - 375 ug/dL Final     IRON  (TRANSFERRIN) SATURATION   Date Value Ref Range Status   07/12/2024 13 (L) 15 - 50 % Final          ASSESSMENT:    ICD-10-CM    1. Iron  deficiency anemia secondary to inadequate dietary iron  intake  D50.8            Assessment & Plan  Iron  deficiency anemia  Iron  deficiency anemia with current hemoglobin levels well-managed. Awaiting further lab results to confirm status.  - Await lab results to determine need for iron  supplementation.  - Consider new iron  pill absorbed in the duodenum if supplementation is needed, as it may cause less constipation and gastrointestinal side effects compared to traditional iron  supplements.  -Ferritin and iron  saturation is low.  Patient called and agreeable to IV iron  but she needs it to be on a Monday around 10-11 am.        Prudy D Dimauro was given the chance to ask questions, and these were answered to their satisfaction. The patient is welcome to call with any questions or concerns in the meantime.     This note was created using [employer] M*Modal Fluency Align mobile application via conversational audio. Consent for audio recording was obtained by patient/family members prior to recording.      Randine Clara APRN, FNP-BC, AOCNP, 07/12/2024 , 10:54       This note was partially generated using MModal Fluency Direct system, and there may be some incorrect words, spellings, and punctuation that were not noted in checking the note before saving.   You can see your note(s) in [facility]. It is common for you to encounter certain medical terminology which may be unfamiliar to you. You might see results before  your provider does so please give at least 2 business days for review. Please have this understanding, that NOT all abnormal results are significant. Our office will contact you for any urgent or emergent action if necessary. If you have any questions or concerns, feel free to send a MyChart message or call the office. Please call with any new or concerning symptoms.     CC:  Rosina Linsey, FNP-C  [id number]  [place] [address]                           [1]   Allergies  Allergen Reactions    Boniva [Ibandronate]     Lipitor [Atorvastatin]

## 2024-07-16 ENCOUNTER — Encounter (HOSPITAL_COMMUNITY): Payer: Self-pay | Admitting: NURSE PRACTITIONER

## 2024-07-18 ENCOUNTER — Encounter (HOSPITAL_COMMUNITY): Payer: Self-pay | Admitting: NURSE PRACTITIONER

## 2024-07-29 ENCOUNTER — Encounter (HOSPITAL_COMMUNITY): Payer: Self-pay

## 2024-07-29 ENCOUNTER — Ambulatory Visit
Admission: RE | Admit: 2024-07-29 | Discharge: 2024-07-29 | Disposition: A | Discharge status: Home / Self Care | Payer: Self-pay | Source: Ambulatory Visit | Attending: NURSE PRACTITIONER | Admitting: NURSE PRACTITIONER

## 2024-07-29 ENCOUNTER — Other Ambulatory Visit: Payer: Self-pay

## 2024-07-29 VITALS — BP 121/66 | HR 85 | Temp 97.2°F | Resp 18

## 2024-07-29 DIAGNOSIS — D508 Other iron deficiency anemias: Secondary | ICD-10-CM | POA: Insufficient documentation

## 2024-07-29 MED ORDER — ALBUTEROL SULFATE 2.5 MG/3 ML (0.083 %) SOLUTION FOR NEBULIZATION
2.5000 mg | INHALATION_SOLUTION | Freq: Once | RESPIRATORY_TRACT | Status: DC | PRN
Start: 2024-07-29 — End: 2024-07-30

## 2024-07-29 MED ORDER — EPINEPHRINE 1 MG/ML (1 ML) INJECTION SOLUTION
0.3000 mg | Freq: Once | INTRAMUSCULAR | Status: DC | PRN
Start: 2024-07-29 — End: 2024-07-30

## 2024-07-29 MED ORDER — DEXTROSE 5% IN WATER (D5W) FLUSH BAG - 250 ML
INTRAVENOUS | Status: DC | PRN
Start: 2024-07-29 — End: 2024-07-30

## 2024-07-29 MED ORDER — LORATADINE 10 MG TABLET
20.0000 mg | ORAL_TABLET | Freq: Once | ORAL | Status: DC | PRN
Start: 2024-07-29 — End: 2024-07-30

## 2024-07-29 MED ORDER — FAMOTIDINE (PF) 20 MG/2 ML INTRAVENOUS SOLUTION
20.0000 mg | Freq: Once | INTRAVENOUS | Status: DC | PRN
Start: 2024-07-29 — End: 2024-07-30

## 2024-07-29 MED ORDER — SODIUM CHLORIDE 0.9 % IV BOLUS
500.0000 mL | INJECTION | Freq: Once | Status: DC | PRN
Start: 2024-07-29 — End: 2024-07-30

## 2024-07-29 MED ORDER — SODIUM CHLORIDE 0.9 % INTRAVENOUS SOLUTION
750.0000 mg | Freq: Once | INTRAVENOUS | Status: AC
Start: 2024-07-29 — End: 2024-07-29
  Administered 2024-07-29: 0 mg via INTRAVENOUS
  Administered 2024-07-29: 750 mg via INTRAVENOUS
  Filled 2024-07-29: qty 15

## 2024-07-29 MED ORDER — PROCHLORPERAZINE MALEATE 10 MG TABLET
10.0000 mg | ORAL_TABLET | Freq: Once | ORAL | Status: DC | PRN
Start: 2024-07-29 — End: 2024-07-30

## 2024-07-29 MED ORDER — ALBUTEROL SULFATE HFA 90 MCG/ACTUATION AEROSOL INHALER - RN
2.0000 | Freq: Once | RESPIRATORY_TRACT | Status: DC | PRN
Start: 2024-07-29 — End: 2024-07-30

## 2024-07-29 MED ORDER — SODIUM CHLORIDE 0.9% FLUSH BAG - 250 ML
INTRAVENOUS | Status: DC | PRN
Start: 2024-07-29 — End: 2024-07-30

## 2024-07-29 MED ORDER — ACETAMINOPHEN 325 MG TABLET
975.0000 mg | ORAL_TABLET | Freq: Once | ORAL | Status: DC | PRN
Start: 2024-07-29 — End: 2024-07-30

## 2024-07-29 MED ORDER — HYDROCORTISONE SOD SUCCINATE 100 MG/2 ML VIAL WRAPPER
100.0000 mg | Freq: Once | INTRAMUSCULAR | Status: DC | PRN
Start: 2024-07-29 — End: 2024-07-30

## 2024-07-29 NOTE — Nurses Notes (Signed)
 9045- Patient ambulatory to unit for Injectafer  infusion. Assessment complete. VSS. Has received infusion previously and states no complications. Orders released to pharmacy. Alejandra Nigh, RN   [phone]- Peripheral IV started to left forearm, patient tolerated well. Iraida Cragin, RN   1022- Injectafer  infusion started. Alejandra Nigh, RN  1145- Injectafer  infusion and flush completed. Patient tolerated well. Alejandra Nigh, RN   [phone]- Peripheral IV removed. Pressure dressing applied. Patient left unit ambulatory at this time. Alejandra Nigh, RN

## 2024-07-30 ENCOUNTER — Encounter (HOSPITAL_COMMUNITY): Payer: Self-pay | Admitting: NURSE PRACTITIONER

## 2024-08-05 ENCOUNTER — Ambulatory Visit
Admission: RE | Admit: 2024-08-05 | Discharge: 2024-08-05 | Disposition: A | Discharge status: Home / Self Care | Payer: Self-pay | Source: Ambulatory Visit | Attending: NURSE PRACTITIONER | Admitting: NURSE PRACTITIONER

## 2024-08-05 ENCOUNTER — Other Ambulatory Visit: Payer: Self-pay

## 2024-08-05 VITALS — BP 120/66 | HR 81 | Temp 97.6°F | Resp 20

## 2024-08-05 DIAGNOSIS — D508 Other iron deficiency anemias: Secondary | ICD-10-CM | POA: Insufficient documentation

## 2024-08-05 MED ORDER — SODIUM CHLORIDE 0.9 % INTRAVENOUS SOLUTION
750.0000 mg | Freq: Once | INTRAVENOUS | Status: AC
Start: 2024-08-05 — End: 2024-08-05
  Administered 2024-08-05: 0 mg via INTRAVENOUS
  Administered 2024-08-05: 750 mg via INTRAVENOUS
  Filled 2024-08-05: qty 15

## 2024-08-05 MED ORDER — ALBUTEROL SULFATE HFA 90 MCG/ACTUATION AEROSOL INHALER - RN
2.0000 | Freq: Once | RESPIRATORY_TRACT | Status: DC | PRN
Start: 2024-08-05 — End: 2024-08-06

## 2024-08-05 MED ORDER — SODIUM CHLORIDE 0.9% FLUSH BAG - 250 ML
INTRAVENOUS | Status: DC | PRN
Start: 2024-08-05 — End: 2024-08-06

## 2024-08-05 MED ORDER — DEXTROSE 5% IN WATER (D5W) FLUSH BAG - 250 ML
INTRAVENOUS | Status: DC | PRN
Start: 2024-08-05 — End: 2024-08-06

## 2024-08-05 MED ORDER — PROCHLORPERAZINE MALEATE 10 MG TABLET
10.0000 mg | ORAL_TABLET | Freq: Once | ORAL | Status: DC | PRN
Start: 2024-08-05 — End: 2024-08-06

## 2024-08-05 MED ORDER — LORATADINE 10 MG TABLET
20.0000 mg | ORAL_TABLET | Freq: Once | ORAL | Status: DC | PRN
Start: 2024-08-05 — End: 2024-08-06

## 2024-08-05 MED ORDER — SODIUM CHLORIDE 0.9 % IV BOLUS
500.0000 mL | INJECTION | Freq: Once | Status: DC | PRN
Start: 2024-08-05 — End: 2024-08-06

## 2024-08-05 MED ORDER — FAMOTIDINE (PF) 20 MG/2 ML INTRAVENOUS SOLUTION
20.0000 mg | Freq: Once | INTRAVENOUS | Status: DC | PRN
Start: 2024-08-05 — End: 2024-08-06

## 2024-08-05 MED ORDER — HYDROCORTISONE SOD SUCCINATE 100 MG/2 ML VIAL WRAPPER
100.0000 mg | Freq: Once | INTRAMUSCULAR | Status: DC | PRN
Start: 2024-08-05 — End: 2024-08-06

## 2024-08-05 MED ORDER — ACETAMINOPHEN 325 MG TABLET
975.0000 mg | ORAL_TABLET | Freq: Once | ORAL | Status: DC | PRN
Start: 2024-08-05 — End: 2024-08-06

## 2024-08-05 MED ORDER — EPINEPHRINE 1 MG/ML (1 ML) INJECTION SOLUTION
0.3000 mg | Freq: Once | INTRAMUSCULAR | Status: DC | PRN
Start: 2024-08-05 — End: 2024-08-06

## 2024-08-05 MED ORDER — ALBUTEROL SULFATE 2.5 MG/3 ML (0.083 %) SOLUTION FOR NEBULIZATION
2.5000 mg | INHALATION_SOLUTION | Freq: Once | RESPIRATORY_TRACT | Status: DC | PRN
Start: 2024-08-05 — End: 2024-08-06

## 2024-08-05 NOTE — Nurses Notes (Signed)
Summary: pt arrived an received iv injectafer with no reactions or distress noted during or after infusion.assessment WNL.treatment complete iv removed with pressure dressing applied.down via walking at DC no concerns voiced upon leaving floor.Ellyson Rarick, LPN

## 2024-08-07 ENCOUNTER — Ambulatory Visit (INDEPENDENT_AMBULATORY_CARE_PROVIDER_SITE_OTHER): Payer: Self-pay | Admitting: NURSE PRACTITIONER

## 2024-09-12 ENCOUNTER — Encounter (INDEPENDENT_AMBULATORY_CARE_PROVIDER_SITE_OTHER): Payer: Self-pay

## 2024-10-18 ENCOUNTER — Ambulatory Visit: Payer: Self-pay | Attending: NURSE PRACTITIONER | Admitting: NURSE PRACTITIONER

## 2024-10-18 ENCOUNTER — Encounter (INDEPENDENT_AMBULATORY_CARE_PROVIDER_SITE_OTHER): Payer: Self-pay | Admitting: NURSE PRACTITIONER

## 2024-10-18 ENCOUNTER — Other Ambulatory Visit: Payer: Self-pay

## 2024-10-18 ENCOUNTER — Ambulatory Visit (INDEPENDENT_AMBULATORY_CARE_PROVIDER_SITE_OTHER)
Admission: RE | Admit: 2024-10-18 | Discharge: 2024-10-18 | Disposition: A | Discharge status: Home / Self Care | Payer: Self-pay | Source: Ambulatory Visit | Attending: NURSE PRACTITIONER | Admitting: NURSE PRACTITIONER

## 2024-10-18 VITALS — BP 122/58 | HR 82 | Temp 97.7°F | Ht 63.0 in | Wt 128.0 lb

## 2024-10-18 DIAGNOSIS — M1990 Unspecified osteoarthritis, unspecified site: Secondary | ICD-10-CM | POA: Insufficient documentation

## 2024-10-18 DIAGNOSIS — D508 Other iron deficiency anemias: Secondary | ICD-10-CM | POA: Insufficient documentation

## 2024-10-18 DIAGNOSIS — Z993 Dependence on wheelchair: Secondary | ICD-10-CM | POA: Insufficient documentation

## 2024-10-18 DIAGNOSIS — S82209A Unspecified fracture of shaft of unspecified tibia, initial encounter for closed fracture: Secondary | ICD-10-CM | POA: Insufficient documentation

## 2024-10-18 DIAGNOSIS — Z9229 Personal history of other drug therapy: Secondary | ICD-10-CM | POA: Insufficient documentation

## 2024-10-18 LAB — IRON TRANSFERRIN AND TIBC
IRON (TRANSFERRIN) SATURATION: 42 % (ref 15–50)
IRON: 133 ug/dL (ref 50–212)
TOTAL IRON BINDING CAPACITY: 319 ug/dL (ref 250–450)
TRANSFERRIN: 228 mg/dL (ref 203–362)
UIBC: 186 ug/dL (ref 130–375)

## 2024-10-18 LAB — COMPREHENSIVE METABOLIC PANEL, NON-FASTING
ALBUMIN/GLOBULIN RATIO: 1.8 — ABNORMAL HIGH (ref 0.8–1.4)
ALBUMIN: 4.4 g/dL (ref 3.5–5.7)
ALKALINE PHOSPHATASE: 63 U/L (ref 34–104)
ALT (SGPT): 12 U/L (ref 7–52)
ANION GAP: 4 mmol/L (ref 4–13)
AST (SGOT): 17 U/L (ref 13–39)
BILIRUBIN TOTAL: 0.4 mg/dL (ref 0.3–1.0)
BUN/CREA RATIO: 15 (ref 6–22)
BUN: 13 mg/dL (ref 7–25)
CALCIUM, CORRECTED: 9.4 mg/dL (ref 8.9–10.8)
CALCIUM: 9.7 mg/dL (ref 8.6–10.3)
CHLORIDE: 108 mmol/L — ABNORMAL HIGH (ref 98–107)
CO2 TOTAL: 28 mmol/L (ref 21–31)
CREATININE: 0.87 mg/dL (ref 0.60–1.30)
ESTIMATED GFR: 70 mL/min/1.73mˆ2 (ref >59)
GLOBULIN: 2.4 (ref 2.0–3.5)
GLUCOSE: 74 mg/dL (ref 74–109)
OSMOLALITY, CALCULATED: 278 mosm/kg (ref 270–290)
POTASSIUM: 4.2 mmol/L (ref 3.5–5.1)
PROTEIN TOTAL: 6.8 g/dL (ref 6.4–8.9)
SODIUM: 140 mmol/L (ref 136–145)

## 2024-10-18 LAB — CBC
HCT: 44.8 % — ABNORMAL HIGH (ref 31.2–41.9)
HGB: 15.6 g/dL — ABNORMAL HIGH (ref 10.9–14.3)
MCH: 29 pg (ref 24.7–32.8)
MCHC: 34.8 g/dL (ref 32.3–35.6)
MCV: 83.4 fL (ref 75.5–95.3)
MPV: 8.2 fL (ref 7.9–10.8)
PLATELETS: 291 x10ˆ3/uL (ref 140–440)
RBC: 5.37 x10ˆ6/uL — ABNORMAL HIGH (ref 3.63–4.92)
RDW: 17.3 % (ref 12.3–17.7)
WBC: 5.9 x10ˆ3/uL (ref 3.8–11.8)

## 2024-10-18 LAB — FERRITIN: FERRITIN: 294 ng/mL (ref 11–307)

## 2024-10-18 NOTE — Cancer Center Note (Signed)
 Katherine [name]  [id number]  February 21, 1951   10/18/2024       Department of Hematology/Oncology  Return Patient Visit           REFERRING PROVIDER:  Fenton Knee, APRN, CNP  [address]  [place],  [place] [address]    PCP:  Manus Endo, MD  [address]  [place] [address]          REASON FOR OFFICE VISIT:  Ongoing management and evaluation of  iron  deficiency anemia      HISTORY OF PRESENT ILLNESS:  History of Present Illness  Katherine [name] is a 73 year old female who presents for follow-up on her iron  levels and management of a tibial fracture. She is accompanied by her son, Katherine [name].    Iron  deficiency and hematologic status  - No cravings or significant fatigue, though some tiredness attributed to limited mobility  - Diet includes iron -rich foods such as salmon, tuna, and scrambled eggs  - Recent laboratory results: hemoglobin 15.6, iron  saturation 42%, ferritin 294 following prior iron  infusions    Tibial fracture and mobility limitations  - Sustained tibial fracture approximately three weeks ago; initially undetected on x-ray, confirmed by MRI  - Non-weight bearing for six weeks  - Uses a brace, removed only for showering and sleeping  - Pain improved with brace use  - Utilizes a walker and electric wheelchair for mobility  - Home environment adapted for limited mobility, including use of a potty chair over the commode and a bath chair for showering    Arthralgia and musculoskeletal symptoms  - History of arthritis complicates mobility, especially with walker use  - Initially had difficulty with a regular walker, now uses a wheeled walker to push herself backward  - No muscle cramps or restless leg symptoms in the contralateral leg              REVIEW OF SYSTEMS:    Past Medical History:   Diagnosis Date    Anemia     B12 deficiency     Esophageal reflux     Essential hypertension     Hx of breast cancer     Hx of transfusion     Hypercholesterolemia     Osteoporosis     Thyroid  disease     Vitamin D   deficiency            Past Surgical History:   Procedure Laterality Date    HX BREAST LUMPECTOMY      HX COLONOSCOPY      HX RADICAL MASTECTOMY      HX TUBAL LIGATION             Social History     Socioeconomic History    Marital status: Married     Spouse name: Not on file    Number of children: Not on file    Years of education: Not on file    Highest education level: Not on file   Occupational History    Not on file   Tobacco Use    Smoking status: Former     Types: Cigarettes    Smokeless tobacco: Never   Vaping Use    Vaping status: Never Used   Substance and Sexual Activity    Alcohol use: Never    Drug use: Never    Sexual activity: Not on file   Other Topics Concern    Not on file   Social History Narrative  Not on file     Social Determinants of Health     Financial Resource Strain: Low Risk (09/27/2022)    Financial Resource Strain     SDOH Financial: No   Transportation Needs: Low Risk (09/27/2022)    Transportation Needs     SDOH Transportation: No   Social Connections: Low Risk (09/27/2022)    Social Connections     SDOH Social Isolation: 5 or more times a week   Intimate Partner Violence: High Risk (09/27/2022)    Intimate Partner Violence     SDOH Domestic Violence: No   Housing Stability: Low Risk (09/27/2022)    Housing Stability     SDOH Housing Situation: I have housing.     SDOH Housing Worry: No       Social History     Social History Narrative    Not on file       Social History     Substance and Sexual Activity   Drug Use Never       Family Medical History:       Problem Relation (Age of Onset)    Blood Clots Daughter    Hypertension (High Blood Pressure) Mother    Lung Cancer Father    Thyroid  Disease Mother              Current Outpatient Medications   Medication Sig    cyanocobalamin (VITAMIN B12) 2,500 mcg Sublingual Tablet, Sublingual Place 1 Tablet (2,500 mcg total) under the tongue Daily    ergocalciferol , vitamin D2, (DRISDOL ) 1,250 mcg (50,000 unit) Oral Capsule Take 1 capsule by  mouth once a week    lactobacillus rhamnosus, GG, (CULTURELLE) 10 billion cell Oral Capsule Take 1 Capsule by mouth Daily    levothyroxine  (SYNTHROID) 75 mcg Oral Tablet Take 1 Tablet (75 mcg total) by mouth Every morning    lisinopriL  (PRINIVIL ) 10 mg Oral Tablet Take 1 Tablet (10 mg total) by mouth Daily Indications: high blood pressure    montelukast  (SINGULAIR ) 10 mg Oral Tablet Take 1 Tablet (10 mg total) by mouth Every evening       Allergies[1]      PHYSICAL EXAM:  BP (!) 122/58 (Site: Left Arm, Patient Position: Sitting, Cuff Size: Adult)   Pulse 82   Temp 36.5 C (97.7 F) (Temporal)   Ht 1.6 m (5' 3)   Wt 58.1 kg (128 lb)   SpO2 95%   BMI 22.67 kg/m        ECOG Status: (0) Fully active, able to carry on all predisease performance without restriction   Physical Exam  GENERAL: Alert, cooperative, well developed, no acute distress.  HEENT: Normocephalic, normal oropharynx, moist mucous membranes, throat normal.  CHEST: Clear to auscultation bilaterally, no wheezes, rhonchi, or crackles.  CARDIOVASCULAR: Normal heart rate and rhythm, S1 and S2 normal without murmurs.  ABDOMEN: Soft, non-tender, non-distended, without organomegaly, normal bowel sounds.  EXTREMITIES: No cyanosis or edema.  NEUROLOGICAL: Cranial nerves grossly intact, moves all extremities without gross motor or sensory deficit.      LABS:   Results  LABS  WBC: 5.9  Hb: 15.6  PLT: 291  Iron  saturation: 42%  Serum iron : 133  Ferritin: 294    RADIOLOGY  Tibia MRI: Fracture detected       ASSESSMENT:    ICD-10-CM    1. Iron  deficiency anemia secondary to inadequate dietary iron  intake  D50.8 COMPREHENSIVE METABOLIC PANEL, NON-FASTING     IRON  TRANSFERRIN AND TIBC  FERRITIN     CBC           Assessment & Plan  Iron  deficiency anemia  Well-managed with recent blood work showing hemoglobin at 15.6, iron  saturation at 42%, and ferritin increased from 8 to 294. No symptoms of fatigue or cravings reported. Current dietary intake includes  salmon, tuna, and scrambled eggs, contributing to iron  levels.  - Continue current dietary intake to maintain iron  levels.  - Scheduled follow-up appointment in four months to reassess iron  levels and overall health.    Return in about 4 months (around 02/16/2025).    Katherine [name] was given the chance to ask questions, and these were answered to their satisfaction. The patient is welcome to call with any questions or concerns in the meantime.     This note was created using [employer] M*Modal Fluency Align mobile application via conversational audio. Consent for audio recording was obtained by patient/family members prior to recording.      Randine Clara APRN, FNP-BC, AOCNP, 10/18/2024 , 11:32       This note was partially generated using MModal Fluency Direct system, and there may be some incorrect words, spellings, and punctuation that were not noted in checking the note before saving.   You can see your note(s) in [email or web address]. It is common for you to encounter certain medical terminology which may be unfamiliar to you. You might see results before your provider does so please give at least 2 business days for review. Please have this understanding, that NOT all abnormal results are significant. Our office will contact you for any urgent or emergent action if necessary. If you have any questions or concerns, feel free to send a MyChart message or call the office. Please call with any new or concerning symptoms.     CC:  Manus Endo, MD  [address]  [place] [address]                           [1]   Allergies  Allergen Reactions    Boniva [Ibandronate]     Lipitor [Atorvastatin]
